# Patient Record
Sex: Female | Born: 1985 | Race: White | Hispanic: No | Marital: Married | Smoking: Never smoker
Health system: Southern US, Community
[De-identification: ages and names within clinical notes are randomized; demographics above are authoritative.]

---

## 2016-03-31 ENCOUNTER — Encounter: Payer: Self-pay | Admitting: Obstetrics and Gynecology

## 2016-03-31 ENCOUNTER — Ambulatory Visit (INDEPENDENT_AMBULATORY_CARE_PROVIDER_SITE_OTHER): Payer: BC Managed Care – PPO | Admitting: Obstetrics and Gynecology

## 2016-03-31 VITALS — BP 133/88 | HR 89 | Wt 138.0 lb

## 2016-03-31 DIAGNOSIS — Z1151 Encounter for screening for human papillomavirus (HPV): Secondary | ICD-10-CM | POA: Diagnosis not present

## 2016-03-31 DIAGNOSIS — Z0000 Encounter for general adult medical examination without abnormal findings: Secondary | ICD-10-CM | POA: Diagnosis not present

## 2016-03-31 DIAGNOSIS — Z01419 Encounter for gynecological examination (general) (routine) without abnormal findings: Secondary | ICD-10-CM | POA: Diagnosis not present

## 2016-03-31 MED ORDER — MEDROXYPROGESTERONE ACETATE 150 MG/ML IM SUSP: 150.0000 mg | INTRAMUSCULAR | 5 refills | Status: DC

## 2016-03-31 NOTE — Progress Notes (Signed)
Subjective:     Bailey Shaffer is a 31 y.o. female G0 P0 with depo-provera induced amenorrhea who is here for a comprehensive physical exam. The patient reports no problems. She relocated from [place] and is here to establish care. She had no complaints. She has been on depo-provera for several years and loves it. She denies pelvic pain or abnormal discharge. She is sexually active with the same partner for the past 7 years and desires to be restarted on her depo-provera. She is a Software engineer at [employer].   No past medical history on file. No past surgical history on file. No family history on file.  Social History   Social History  . Marital status: Married    Spouse name: N/A  . Number of children: N/A  . Years of education: N/A   Occupational History  . Not on file.   Social History Main Topics  . Smoking status: Never Smoker  . Smokeless tobacco: Never Used  . Alcohol use Yes  . Drug use: No  . Sexual activity: Yes    Partners: Male    Birth control/ protection: Injection   Other Topics Concern  . Not on file   Social History Narrative  . No narrative on file   Health Maintenance  Topic Date Due  . HIV Screening  09/09/1999  . TETANUS/TDAP  09/09/2003  . PAP SMEAR  09/08/2005  . INFLUENZA VACCINE  09/02/2015       Review of Systems Pertinent items are noted in HPI.   Objective:  Blood pressure 133/88, pulse 89, weight 138 lb (62.6 kg).     GENERAL: Well-developed, well-nourished female in no acute distress.  HEENT: Normocephalic, atraumatic. Sclerae anicteric.  NECK: Supple. Normal thyroid.  LUNGS: Clear to auscultation bilaterally.  HEART: Regular rate and rhythm. BREASTS: Symmetric in size. No palpable masses or lymphadenopathy, skin changes, or nipple drainage. ABDOMEN: Soft, nontender, nondistended.  PELVIC: Normal external female genitalia. Vagina is pink and rugated.  Normal discharge. Normal appearing cervix. Uterus is normal in size. No adnexal mass  or tenderness. EXTREMITIES: No cyanosis, clubbing, or edema, 2+ distal pulses.    Assessment:    Healthy female exam.      Plan:    pap smear collected Patient will be notified of abnormal results Rx depo-provera provided. Patient has been abstinent for 3 weeks RTC for depo-provera or prn See After Visit Summary for Counseling Recommendations

## 2016-04-02 ENCOUNTER — Ambulatory Visit (INDEPENDENT_AMBULATORY_CARE_PROVIDER_SITE_OTHER): Payer: BC Managed Care – PPO | Admitting: *Deleted

## 2016-04-02 DIAGNOSIS — Z30013 Encounter for initial prescription of injectable contraceptive: Secondary | ICD-10-CM

## 2016-04-02 DIAGNOSIS — Z3202 Encounter for pregnancy test, result negative: Secondary | ICD-10-CM | POA: Diagnosis not present

## 2016-04-02 LAB — POCT URINE PREGNANCY: Preg Test, Ur: NEGATIVE

## 2016-04-02 MED ORDER — MEDROXYPROGESTERONE ACETATE 150 MG/ML IM SUSP
150.0000 mg | Freq: Once | INTRAMUSCULAR | Status: AC
  Administered 2016-04-02: 150 mg via INTRAMUSCULAR

## 2016-04-05 LAB — CYTOLOGY - PAP
Diagnosis: NEGATIVE
HPV (WINDOPATH): NOT DETECTED

## 2016-06-24 ENCOUNTER — Ambulatory Visit (INDEPENDENT_AMBULATORY_CARE_PROVIDER_SITE_OTHER): Payer: BC Managed Care – PPO

## 2016-06-24 VITALS — Ht 63.0 in

## 2016-06-24 DIAGNOSIS — Z3042 Encounter for surveillance of injectable contraceptive: Secondary | ICD-10-CM

## 2016-06-24 MED ORDER — MEDROXYPROGESTERONE ACETATE 150 MG/ML IM SUSP
150.0000 mg | Freq: Once | INTRAMUSCULAR | Status: AC
  Administered 2016-06-24: 150 mg via INTRAMUSCULAR

## 2016-06-24 NOTE — Progress Notes (Addendum)
Depo given L upper outer quad w/o difficulty. Next depo due 8/15

## 2016-07-30 ENCOUNTER — Encounter (HOSPITAL_COMMUNITY): Payer: Self-pay | Admitting: *Deleted

## 2016-07-30 ENCOUNTER — Emergency Department (HOSPITAL_COMMUNITY): Payer: BC Managed Care – PPO

## 2016-07-30 ENCOUNTER — Observation Stay (HOSPITAL_COMMUNITY)
Admission: EM | Admit: 2016-07-30 | Discharge: 2016-07-31 | Disposition: A | Discharge status: Home / Self Care | Payer: BC Managed Care – PPO | Attending: General Surgery | Admitting: General Surgery

## 2016-07-30 DIAGNOSIS — R1011 Right upper quadrant pain: Secondary | ICD-10-CM

## 2016-07-30 DIAGNOSIS — Z881 Allergy status to other antibiotic agents status: Secondary | ICD-10-CM | POA: Insufficient documentation

## 2016-07-30 DIAGNOSIS — K8010 Calculus of gallbladder with chronic cholecystitis without obstruction: Secondary | ICD-10-CM | POA: Diagnosis not present

## 2016-07-30 DIAGNOSIS — K8020 Calculus of gallbladder without cholecystitis without obstruction: Secondary | ICD-10-CM | POA: Diagnosis present

## 2016-07-30 LAB — COMPREHENSIVE METABOLIC PANEL
ALK PHOS: 40 U/L (ref 38–126)
ALT: 18 U/L (ref 14–54)
AST: 25 U/L (ref 15–41)
Albumin: 4.3 g/dL (ref 3.5–5.0)
Anion gap: 10 (ref 5–15)
BUN: 7 mg/dL (ref 6–20)
CALCIUM: 9.1 mg/dL (ref 8.9–10.3)
CHLORIDE: 104 mmol/L (ref 101–111)
CO2: 22 mmol/L (ref 22–32)
CREATININE: 0.92 mg/dL (ref 0.44–1.00)
GFR calc Af Amer: >60 mL/min (ref >60)
Glucose, Bld: 147 mg/dL — ABNORMAL HIGH (ref 65–99)
Potassium: 3.8 mmol/L (ref 3.5–5.1)
Sodium: 136 mmol/L (ref 135–145)
Total Bilirubin: 0.6 mg/dL (ref 0.3–1.2)
Total Protein: 6.7 g/dL (ref 6.5–8.1)

## 2016-07-30 LAB — URINALYSIS, ROUTINE W REFLEX MICROSCOPIC
Bilirubin Urine: NEGATIVE
GLUCOSE, UA: 50 mg/dL — AB
HGB URINE DIPSTICK: NEGATIVE
KETONES UR: 80 mg/dL — AB
Leukocytes, UA: NEGATIVE
Nitrite: NEGATIVE
PROTEIN: NEGATIVE mg/dL
Specific Gravity, Urine: 1.044 — ABNORMAL HIGH (ref 1.005–1.030)
pH: 7 (ref 5.0–8.0)

## 2016-07-30 LAB — CBC
HCT: 41.9 % (ref 36.0–46.0)
HEMATOCRIT: 40.1 % (ref 36.0–46.0)
HEMOGLOBIN: 13.5 g/dL (ref 12.0–15.0)
Hemoglobin: 14.1 g/dL (ref 12.0–15.0)
MCH: 30.4 pg (ref 26.0–34.0)
MCH: 30.9 pg (ref 26.0–34.0)
MCHC: 33.7 g/dL (ref 30.0–36.0)
MCHC: 33.7 g/dL (ref 30.0–36.0)
MCV: 90.3 fL (ref 78.0–100.0)
MCV: 91.8 fL (ref 78.0–100.0)
PLATELETS: 282 10*3/uL (ref 150–400)
Platelets: 240 10*3/uL (ref 150–400)
RBC: 4.64 MIL/uL (ref 3.87–5.11)
RBC: 4.37 MIL/uL (ref 3.87–5.11)
RDW: 12 % (ref 11.5–15.5)
RDW: 12.2 % (ref 11.5–15.5)
WBC: 10.3 10*3/uL (ref 4.0–10.5)
WBC: 6.4 10*3/uL (ref 4.0–10.5)

## 2016-07-30 LAB — CREATININE, SERUM
CREATININE: 0.72 mg/dL (ref 0.44–1.00)
GFR calc non Af Amer: >60 mL/min (ref >60)

## 2016-07-30 LAB — I-STAT BETA HCG BLOOD, ED (MC, WL, AP ONLY): I-stat hCG, quantitative: <5 m[IU]/mL (ref <5)

## 2016-07-30 LAB — MRSA PCR SCREENING: MRSA by PCR: NEGATIVE

## 2016-07-30 LAB — LIPASE, BLOOD: LIPASE: 121 U/L — AB (ref 11–51)

## 2016-07-30 MED ORDER — SODIUM CHLORIDE 0.45 % IV SOLN
INTRAVENOUS | Status: DC
  Administered 2016-07-30 – 2016-07-31 (×2): via INTRAVENOUS
  Filled 2016-07-30 (×3): qty 1000

## 2016-07-30 MED ORDER — ONDANSETRON HCL 4 MG/2ML IJ SOLN
4.0000 mg | Freq: Four times a day (QID) | INTRAMUSCULAR | Status: DC | PRN
  Administered 2016-07-31: 4 mg via INTRAVENOUS

## 2016-07-30 MED ORDER — ONDANSETRON 4 MG PO TBDP: 4.0000 mg | ORAL_TABLET | Freq: Four times a day (QID) | ORAL | Status: DC | PRN

## 2016-07-30 MED ORDER — MORPHINE SULFATE (PF) 4 MG/ML IV SOLN
4.0000 mg | Freq: Once | INTRAVENOUS | Status: AC
  Administered 2016-07-30: 4 mg via INTRAVENOUS
  Filled 2016-07-30: qty 1

## 2016-07-30 MED ORDER — DEXTROSE 5 % IV SOLN
2.0000 g | Freq: Once | INTRAVENOUS | Status: AC
  Administered 2016-07-30: 2 g via INTRAVENOUS
  Filled 2016-07-30: qty 2

## 2016-07-30 MED ORDER — HYDRALAZINE HCL 20 MG/ML IJ SOLN: 10.0000 mg | INTRAMUSCULAR | Status: DC | PRN

## 2016-07-30 MED ORDER — KETOROLAC TROMETHAMINE 30 MG/ML IJ SOLN
30.0000 mg | Freq: Once | INTRAMUSCULAR | Status: AC
  Administered 2016-07-30: 30 mg via INTRAVENOUS
  Filled 2016-07-30: qty 1

## 2016-07-30 MED ORDER — ENOXAPARIN SODIUM 40 MG/0.4ML ~~LOC~~ SOLN: 40.0000 mg | SUBCUTANEOUS | Status: DC

## 2016-07-30 MED ORDER — ACETAMINOPHEN 325 MG PO TABS: 650.0000 mg | ORAL_TABLET | Freq: Four times a day (QID) | ORAL | Status: DC | PRN

## 2016-07-30 MED ORDER — SIMETHICONE 80 MG PO CHEW
40.0000 mg | CHEWABLE_TABLET | Freq: Four times a day (QID) | ORAL | Status: DC | PRN
  Administered 2016-07-31: 40 mg via ORAL
  Filled 2016-07-30: qty 1

## 2016-07-30 MED ORDER — DIPHENHYDRAMINE HCL 50 MG/ML IJ SOLN: 25.0000 mg | Freq: Four times a day (QID) | INTRAMUSCULAR | Status: DC | PRN

## 2016-07-30 MED ORDER — IOPAMIDOL (ISOVUE-300) INJECTION 61%
INTRAVENOUS | Status: AC
  Administered 2016-07-30: 100 mL
  Filled 2016-07-30: qty 100

## 2016-07-30 MED ORDER — MORPHINE SULFATE (PF) 4 MG/ML IV SOLN
1.0000 mg | INTRAVENOUS | Status: DC | PRN
  Administered 2016-07-30: 2 mg via INTRAVENOUS
  Administered 2016-07-30: 4 mg via INTRAVENOUS
  Administered 2016-07-31 (×2): 2 mg via INTRAVENOUS
  Filled 2016-07-30 (×4): qty 1

## 2016-07-30 MED ORDER — ONDANSETRON HCL 4 MG/2ML IJ SOLN
4.0000 mg | Freq: Once | INTRAMUSCULAR | Status: AC
  Administered 2016-07-30: 4 mg via INTRAVENOUS
  Filled 2016-07-30: qty 2

## 2016-07-30 MED ORDER — VORTIOXETINE HBR 5 MG PO TABS
5.0000 mg | ORAL_TABLET | Freq: Every day | ORAL | Status: DC
  Administered 2016-07-30 – 2016-07-31 (×2): 5 mg via ORAL
  Filled 2016-07-30 (×2): qty 1

## 2016-07-30 MED ORDER — DIPHENHYDRAMINE HCL 25 MG PO CAPS: 25.0000 mg | ORAL_CAPSULE | Freq: Four times a day (QID) | ORAL | Status: DC | PRN

## 2016-07-30 MED ORDER — SODIUM CHLORIDE 0.9 % IV BOLUS (SEPSIS)
1000.0000 mL | Freq: Once | INTRAVENOUS | Status: AC
Start: 2016-07-30 — End: 2016-07-30
  Administered 2016-07-30: 1000 mL via INTRAVENOUS

## 2016-07-30 MED ORDER — DEXTROSE 5 % IV SOLN
2.0000 g | INTRAVENOUS | Status: DC
  Filled 2016-07-30: qty 2

## 2016-07-30 NOTE — ED Notes (Signed)
Patient transported to Ultrasound 

## 2016-07-30 NOTE — ED Notes (Signed)
Patient transported to CT 

## 2016-07-30 NOTE — ED Notes (Signed)
Report given to floor RN; no questions.

## 2016-07-30 NOTE — H&P (Signed)
Bailey Shaffer is an 31 y.o. female.   Chief Complaint: abd pain HPI: 58 yof with ruq pain intermittently for several months presents with unrelenting abdominal pain in back and ruq.  She has nausea/vomiting and cannot tolerate orally.  She has no fever. Is having bowel movements. Interestingly in [place] she was told she had elevated lipase and gallstones during annual physical exam in recent past.  She has mildly elevated lipase and underwent an [place] that shows multiple gallstones in the neck, no sono murphys sign with 5.3 mm duct.  She also underwent a ct scan per the er that shows some periportal edema present.  Nothing is making pain better at home.  Was not going away.  Food has made worse in past.  History reviewed. No pertinent past medical history. anxiety  History reviewed. No pertinent surgical history. tonsillectomy  No family history on file. Social History:  reports that she has never smoked. She has never used smokeless tobacco. She reports that she drinks about 8.4 oz of alcohol per week . She reports that she does not use drugs.  Allergies:  Allergies  Allergen Reactions  . Erythromycin Rash    Pt states, "I am sensitive to most antibiotics."    meds reviewed  Results for orders placed or performed during the hospital encounter of 07/30/16 (from the past 48 hour(s))  Lipase, blood     Status: Abnormal   Collection Time: 07/30/16  8:31 AM  Result Value Ref Range   Lipase 121 (H) 11 - 51 U/L  Comprehensive metabolic panel     Status: Abnormal   Collection Time: 07/30/16  8:31 AM  Result Value Ref Range   Sodium 136 135 - 145 mmol/L   Potassium 3.8 3.5 - 5.1 mmol/L   Chloride 104 101 - 111 mmol/L   CO2 22 22 - 32 mmol/L   Glucose, Bld 147 (H) 65 - 99 mg/dL   BUN 7 6 - 20 mg/dL   Creatinine, Ser 0.92 0.44 - 1.00 mg/dL   Calcium 9.1 8.9 - 10.3 mg/dL   Total Protein 6.7 6.5 - 8.1 g/dL   Albumin 4.3 3.5 - 5.0 g/dL   AST 25 15 - 41 U/L   ALT 18 14 - 54 U/L   Alkaline  Phosphatase 40 38 - 126 U/L   Total Bilirubin 0.6 0.3 - 1.2 mg/dL   GFR calc non Af Amer >60 >60 mL/min   GFR calc Af Amer >60 >60 mL/min    Comment: (NOTE) The eGFR has been calculated using the CKD EPI equation. This calculation has not been validated in all clinical situations. eGFR's persistently <60 mL/min signify possible Chronic Kidney Disease.    Anion gap 10 5 - 15  CBC     Status: None   Collection Time: 07/30/16  8:31 AM  Result Value Ref Range   WBC 10.3 4.0 - 10.5 K/uL   RBC 4.64 3.87 - 5.11 MIL/uL   Hemoglobin 14.1 12.0 - 15.0 g/dL   HCT 41.9 36.0 - 46.0 %   MCV 90.3 78.0 - 100.0 fL   MCH 30.4 26.0 - 34.0 pg   MCHC 33.7 30.0 - 36.0 g/dL   RDW 12.0 11.5 - 15.5 %   Platelets 282 150 - 400 K/uL  I-Stat beta hCG blood, ED     Status: None   Collection Time: 07/30/16 11:31 AM  Result Value Ref Range   I-stat hCG, quantitative <5.0 <5 mIU/mL   Comment 3  Comment:   GEST. AGE      CONC.  (mIU/mL)   <=1 WEEK        5 - 50     2 WEEKS       50 - 500     3 WEEKS       100 - 10,000     4 WEEKS     1,000 - 30,000        FEMALE AND NON-PREGNANT FEMALE:     LESS THAN 5 mIU/mL    Ct Abdomen Pelvis W Contrast  Result Date: 07/30/2016 CLINICAL DATA:  Central abdominal pain since 5 a.m. this morning. Nausea and vomiting. EXAM: CT ABDOMEN AND PELVIS WITH CONTRAST TECHNIQUE: Multidetector CT imaging of the abdomen and pelvis was performed using the standard protocol following bolus administration of intravenous contrast. CONTRAST:  100 cc Isovue-300 COMPARISON:  Ultrasound exam from earlier today. FINDINGS: Lower chest:  Lung bases unremarkable. Hepatobiliary: No focal abnormality is seen within the liver parenchyma. There is evidence of periportal edema. Small amount of fluid is seen adjacent to the inferior vena cava near the hepatic vein confluence. Gallbladder is distended. Gallstones seen on ultrasound earlier today not well demonstrated by CT. Small volume  pericholecystic fluid is evident. No intrahepatic or extrahepatic biliary dilation. Pancreas: No focal mass lesion. No dilatation of the main duct. No intraparenchymal cyst. No peripancreatic edema. Spleen: No splenomegaly. No focal mass lesion. Adrenals/Urinary Tract: No adrenal nodule or mass. Kidneys are unremarkable. No evidence for hydroureter. The urinary bladder appears normal for the degree of distention. Stomach/Bowel: Stomach is nondistended. No gastric wall thickening. No evidence of outlet obstruction. Duodenum is normally positioned as is the ligament of Treitz. No small bowel wall thickening. No small bowel dilatation. The terminal ileum is normal. The appendix is normal. No gross colonic mass. No colonic wall thickening. No substantial diverticular change. Vascular/Lymphatic: No abdominal aortic aneurysm. No abdominal aortic atherosclerotic calcification. There is no gastrohepatic or hepatoduodenal ligament lymphadenopathy. No intraperitoneal or retroperitoneal lymphadenopathy. No pelvic sidewall lymphadenopathy. Reproductive: The uterus has normal CT imaging appearance. There is no adnexal mass. Other: No intraperitoneal free fluid. Musculoskeletal: Bone windows reveal no worrisome lytic or sclerotic osseous lesions. IMPRESSION: 1. Periportal edema with fluid between the liver and the IVC near the hepatic vein confluence and a tiny amount of fluid seen in the gallbladder fossa. Patient is known to have gallstones by ultrasound exam performed earlier today. Periportal edema is not classically associated with acute cholecystitis although edema secondary to gallbladder inflammation is a possibility. If clinical picture points towards acute cholecystitis, nuclear scintigraphy may prove helpful to assess for cystic duct obstruction. Acute hepatitis (including viral etiology) and cholangitis would also be considerations. Periportal edema from causes such as blunt force trauma, fluid resuscitation and  cardiac dysfunction unlikely given lack of such history. Electronically Signed   By: Misty Stanley M.D.   On: 07/30/2016 12:29   US Abdomen Limited Ruq  Result Date: 07/30/2016 CLINICAL DATA:  Right upper quadrant pain 6 hours with nausea vomiting EXAM: ULTRASOUND ABDOMEN LIMITED RIGHT UPPER QUADRANT COMPARISON:  None. FINDINGS: Gallbladder: Multiple gallstones measuring up to 21 mm in the neck of the gallbladder. Negative sonographic Murphy sign. Gallbladder wall not thickened. Common bile duct: Diameter: 5.3 mm Liver: No focal lesion identified. Within normal limits in parenchymal echogenicity. IMPRESSION: Cholelithiasis Electronically Signed   By: Franchot Gallo M.D.   On: 07/30/2016 10:32    Review of Systems  Constitutional: Negative for chills  and fever.  Cardiovascular: Negative for chest pain.  Gastrointestinal: Positive for abdominal pain, nausea and vomiting. Negative for constipation and diarrhea.  All other systems reviewed and are negative.   Blood pressure 106/64, pulse 90, temperature 97.6 F (36.4 C), temperature source Oral, height '5\' 4"'$  (1.626 m), weight 59 kg (130 lb), SpO2 100 %. Physical Exam  Vitals reviewed. Constitutional: She is oriented to person, place, and time. She appears well-developed and well-nourished.  HENT:  Head: Normocephalic and atraumatic.  Right Ear: External ear normal.  Left Ear: External ear normal.  Eyes: EOM are normal. No scleral icterus.  Neck: Neck supple.  Cardiovascular: Normal rate, regular rhythm and normal heart sounds.   Respiratory: Effort normal and breath sounds normal. She has no wheezes. She has no rales.  GI: Soft. Bowel sounds are normal. She exhibits no distension. There is tenderness in the right upper quadrant and epigastric area.  Lymphadenopathy:    She has no cervical adenopathy.  Neurological: She is alert and oriented to person, place, and time.  Skin: Skin is warm and dry. She is not diaphoretic.  Psychiatric: She  has a normal mood and affect. Her behavior is normal. Judgment and thought content normal.     Assessment/Plan Gallstones  May have had pancreatitis before not really consistent with that now given lipase mild elevation but ct does show some fluid.  She has symptomatic cholelithaisis and will plan on admission with iv abx given duration of pain. Will recheck labs in am and then if ok will proceed with lap chole tomorrow. I discussed the procedure in detail.  We discussed the risks and benefits of a laparoscopic cholecystectomy and possible cholangiogram including, but not limited to bleeding, infection, injury to surrounding structures such as the intestine or liver, bile leak, retained gallstones, need to convert to an open procedure, prolonged diarrhea, blood clots such as  DVT, common bile duct injury, anesthesia risks, and possible need for additional procedures.  The likelihood of improvement in symptoms and return to the patient's normal status is good. We discussed the typical post-operative recovery course.   Rolm Bookbinder, MD 07/30/2016, 1:26 PM

## 2016-07-30 NOTE — ED Notes (Signed)
Pt back from Ultrasound

## 2016-07-30 NOTE — ED Notes (Signed)
Pt asked to give a urine sample pt stated "i cant do anything".

## 2016-07-30 NOTE — ED Notes (Signed)
Surgery at bedside.

## 2016-07-30 NOTE — ED Provider Notes (Signed)
Emergency Department Provider Note   I have reviewed the triage vital signs and the nursing notes.   HISTORY  Chief Complaint Abdominal Pain   HPI Bailey Shaffer is a 31 y.o. female with PMH of intermittent upper abdominal pain presents to the emergency department for evaluation of sudden onset, severe epigastric pain. She describes it as a sharp pain like stabbing. She was having some discomfort yesterday evening but the pain became very severe at 4 AM this morning. She's had similar pain in the past but none this severe. Quality in [place] the patient had CT scan and ultrasound of the gallbladder which were reportedly normal. She reports having an abdominal MRI because of elevated lipase and amylase. She states that her pancreas, gallbladder, and liver were reportedly normal. Shortly after arrival to the emergency department she had some associated vomiting that was nonbloody. No diarrhea. No vaginal bleeding or discharge. The pain radiates to the bilateral flanks. Denies any lower abdominal pain. No modifying factors.   History reviewed. No pertinent past medical history.  Patient Active Problem List   Diagnosis Date Noted  . Symptomatic cholelithiasis 07/30/2016    History reviewed. No pertinent surgical history.  Current Outpatient Rx  . Order #: [id number] Class: Historical Med  . Order #: [id number] Class: Print  . Order #: [id number] Class: Historical Med    Allergies Erythromycin  No family history on file.  Social History Social History  Substance Use Topics  . Smoking status: Never Smoker  . Smokeless tobacco: Never Used  . Alcohol use 8.4 oz/week    14 Glasses of wine per week    Review of Systems  Constitutional: No fever/chills Eyes: No visual changes. ENT: No sore throat. Cardiovascular: Denies chest pain. Respiratory: Denies shortness of breath. Gastrointestinal: Positive epigastric abdominal pain. Positive nausea and vomiting.  No diarrhea.  No  constipation. Genitourinary: Negative for dysuria. Musculoskeletal: Negative for back pain. Skin: Negative for rash. Neurological: Negative for headaches, focal weakness or numbness.  10-point ROS otherwise negative.  ____________________________________________   PHYSICAL EXAM:  VITAL SIGNS: ED Triage Vitals  Enc Vitals Group     BP 07/30/16 0827 101/67     Pulse Rate 07/30/16 0827 (!) 58     Resp --      Temp 07/30/16 0827 97.6 F (36.4 C)     Temp Source 07/30/16 0827 Oral     SpO2 07/30/16 0827 100 %     Weight 07/30/16 0827 130 lb (59 kg)     Height 07/30/16 0827 5\' 4"  (1.626 m)     Pain Score 07/30/16 0833 9   Constitutional: Alert and oriented. Appears uncomfortable with frequent shifting in the bed.  Eyes: Conjunctivae are normal. Head: Atraumatic. Nose: No congestion/rhinnorhea. Mouth/Throat: Mucous membranes are moist.  Oropharynx non-erythematous. Neck: No stridor.   Cardiovascular: Bradycardia. Good peripheral circulation. Grossly normal heart sounds.   Respiratory: Normal respiratory effort.  No retractions. Lungs CTAB. Gastrointestinal: Soft with focal epigastric tenderness to the epigastric region, worse of the RUQ with positive Murphy's sign. No distention.  Musculoskeletal: No lower extremity tenderness nor edema. No gross deformities of extremities. Neurologic:  Normal speech and language. No gross focal neurologic deficits are appreciated.  Skin:  Skin is warm, dry and intact. No rash noted.  ____________________________________________   LABS (all labs ordered are listed, but only abnormal results are displayed)  Labs Reviewed  LIPASE, BLOOD - Abnormal; Notable for the following:       Result Value   Lipase 121 (*)  All other components within normal limits  COMPREHENSIVE METABOLIC PANEL - Abnormal; Notable for the following:    Glucose, Bld 147 (*)    All other components within normal limits  CBC  URINALYSIS, ROUTINE W REFLEX MICROSCOPIC    HIV ANTIBODY (ROUTINE TESTING)  CBC  CREATININE, SERUM  I-STAT BETA HCG BLOOD, ED ([facility], [facility], [facility] ONLY)   ____________________________________________  RADIOLOGY  Ct Abdomen Pelvis W Contrast  Result Date: 07/30/2016 CLINICAL DATA:  Central abdominal pain since 5 a.m. this morning. Nausea and vomiting. EXAM: CT ABDOMEN AND PELVIS WITH CONTRAST TECHNIQUE: Multidetector CT imaging of the abdomen and pelvis was performed using the standard protocol following bolus administration of intravenous contrast. CONTRAST:  100 cc Isovue-300 COMPARISON:  Ultrasound exam from earlier today. FINDINGS: Lower chest:  Lung bases unremarkable. Hepatobiliary: No focal abnormality is seen within the liver parenchyma. There is evidence of periportal edema. Small amount of fluid is seen adjacent to the inferior vena cava near the hepatic vein confluence. Gallbladder is distended. Gallstones seen on ultrasound earlier today not well demonstrated by CT. Small volume pericholecystic fluid is evident. No intrahepatic or extrahepatic biliary dilation. Pancreas: No focal mass lesion. No dilatation of the main duct. No intraparenchymal cyst. No peripancreatic edema. Spleen: No splenomegaly. No focal mass lesion. Adrenals/Urinary Tract: No adrenal nodule or mass. Kidneys are unremarkable. No evidence for hydroureter. The urinary bladder appears normal for the degree of distention. Stomach/Bowel: Stomach is nondistended. No gastric wall thickening. No evidence of outlet obstruction. Duodenum is normally positioned as is the ligament of Treitz. No small bowel wall thickening. No small bowel dilatation. The terminal ileum is normal. The appendix is normal. No gross colonic mass. No colonic wall thickening. No substantial diverticular change. Vascular/Lymphatic: No abdominal aortic aneurysm. No abdominal aortic atherosclerotic calcification. There is no gastrohepatic or hepatoduodenal ligament lymphadenopathy. No intraperitoneal or  retroperitoneal lymphadenopathy. No pelvic sidewall lymphadenopathy. Reproductive: The uterus has normal CT imaging appearance. There is no adnexal mass. Other: No intraperitoneal free fluid. Musculoskeletal: Bone windows reveal no worrisome lytic or sclerotic osseous lesions. IMPRESSION: 1. Periportal edema with fluid between the liver and the IVC near the hepatic vein confluence and a tiny amount of fluid seen in the gallbladder fossa. Patient is known to have gallstones by ultrasound exam performed earlier today. Periportal edema is not classically associated with acute cholecystitis although edema secondary to gallbladder inflammation is a possibility. If clinical picture points towards acute cholecystitis, nuclear scintigraphy may prove helpful to assess for cystic duct obstruction. Acute hepatitis (including viral etiology) and cholangitis would also be considerations. Periportal edema from causes such as blunt force trauma, fluid resuscitation and cardiac dysfunction unlikely given lack of such history. Electronically Signed   By: Kennith Center M.D.   On: 07/30/2016 12:29   US Abdomen Limited Ruq  Result Date: 07/30/2016 CLINICAL DATA:  Right upper quadrant pain 6 hours with nausea vomiting EXAM: ULTRASOUND ABDOMEN LIMITED RIGHT UPPER QUADRANT COMPARISON:  None. FINDINGS: Gallbladder: Multiple gallstones measuring up to 21 mm in the neck of the gallbladder. Negative sonographic Murphy sign. Gallbladder wall not thickened. Common bile duct: Diameter: 5.3 mm Liver: No focal lesion identified. Within normal limits in parenchymal echogenicity. IMPRESSION: Cholelithiasis Electronically Signed   By: Marlan Palau M.D.   On: 07/30/2016 10:32    ____________________________________________   PROCEDURES  Procedure(s) performed:   Procedures  None ____________________________________________   INITIAL IMPRESSION / ASSESSMENT AND PLAN / ED COURSE  Pertinent labs & imaging results that were  available during my care of the patient were reviewed by me and considered in my medical decision making (see chart for details).  Patient presents to the emergency department for evaluation of severe epigastric abdominal pain worse in the right upper quadrant. She apparently had a thorough workup in [place] including ultrasound, CT, MRI of the abdomen with no acute abnormality found. No history of upper endoscopy. The patient appears uncomfortable with some focal tenderness worse in the right upper quadrant. Plan for ultrasound of the right upper quadrant to evaluate the gallbladder. We'll treat with morphine and Zofran along with IV fluids in the interim. No lower abdominal symptoms to suggest pelvic etiology.   10:50 AM Patient looking more comfortable but still complaining of pain. Plan for CT for further investigation. Could be symptomatic cholelithiasis but will follow CT and reassess.   12:44 PM Patient looking more comfortable. Plan for Surgery consultation with small amount of fluid seen and clinical concern for symptomatic cholelithiasis.   Discussed patient's case with General Surgery. Patient and family (if present) updated with plan. Care transferred to Surgery service.  I reviewed all nursing notes, vitals, pertinent old records, EKGs, labs, imaging (as available).  ____________________________________________  FINAL CLINICAL IMPRESSION(S) / ED DIAGNOSES  Final diagnoses:  RUQ abdominal pain     MEDICATIONS GIVEN DURING THIS VISIT:  Medications  cefTRIAXone (ROCEPHIN) 2 g in dextrose 5 % 50 mL IVPB (not administered)  vortioxetine HBr (TRINTELLIX) TABS 5 mg (not administered)  enoxaparin (LOVENOX) injection 40 mg (not administered)  hydrALAZINE (APRESOLINE) injection 10 mg (not administered)  simethicone (MYLICON) chewable tablet 40 mg (not administered)  ondansetron (ZOFRAN-ODT) disintegrating tablet 4 mg (not administered)    Or  ondansetron (ZOFRAN) injection 4 mg  (not administered)  diphenhydrAMINE (BENADRYL) capsule 25 mg (not administered)    Or  diphenhydrAMINE (BENADRYL) injection 25 mg (not administered)  morphine 4 MG/ML injection 1-4 mg (not administered)  acetaminophen (TYLENOL) tablet 650 mg (not administered)  sodium chloride 0.45 % 1,000 mL infusion (not administered)  sodium chloride 0.9 % bolus 1,000 mL (0 mLs Intravenous Stopped 07/30/16 1123)  morphine 4 MG/ML injection 4 mg (4 mg Intravenous Given 07/30/16 1010)  ondansetron (ZOFRAN) injection 4 mg (4 mg Intravenous Given 07/30/16 1009)  sodium chloride 0.9 % bolus 1,000 mL (0 mLs Intravenous Stopped 07/30/16 1252)  ketorolac (TORADOL) 30 MG/ML injection 30 mg (30 mg Intravenous Given 07/30/16 1222)  iopamidol (ISOVUE-300) 61 % injection (100 mLs  Contrast Given 07/30/16 1202)     NEW OUTPATIENT MEDICATIONS STARTED DURING THIS VISIT:  None   Note:  This document was prepared using Dragon voice recognition software and may include unintentional dictation errors.  Alona Bene, MD Emergency Medicine    Edithe Dobbin, Arlyss Repress, MD 07/30/16 [id number]

## 2016-07-30 NOTE — ED Notes (Signed)
Consent signed at this time.

## 2016-07-30 NOTE — ED Triage Notes (Signed)
Pt reports hx of epigastric pain  & heartburn, pt reports worsening of pain in upper mid abd last night not relieved with medicine, pt denies n/v/d, A&O x4

## 2016-07-31 ENCOUNTER — Encounter (HOSPITAL_COMMUNITY)
Admission: EM | Disposition: A | Discharge status: Home / Self Care | Payer: Self-pay | Source: Home / Self Care | Attending: Emergency Medicine

## 2016-07-31 ENCOUNTER — Observation Stay (HOSPITAL_COMMUNITY): Payer: BC Managed Care – PPO | Admitting: Certified Registered Nurse Anesthetist

## 2016-07-31 ENCOUNTER — Encounter (HOSPITAL_COMMUNITY): Payer: Self-pay | Admitting: Certified Registered Nurse Anesthetist

## 2016-07-31 HISTORY — PX: CHOLECYSTECTOMY: SHX55

## 2016-07-31 LAB — CBC
HCT: 34.3 % — ABNORMAL LOW (ref 36.0–46.0)
Hemoglobin: 11.7 g/dL — ABNORMAL LOW (ref 12.0–15.0)
MCH: 31 pg (ref 26.0–34.0)
MCHC: 34.1 g/dL (ref 30.0–36.0)
MCV: 90.7 fL (ref 78.0–100.0)
PLATELETS: 198 10*3/uL (ref 150–400)
RBC: 3.78 MIL/uL — ABNORMAL LOW (ref 3.87–5.11)
RDW: 12.2 % (ref 11.5–15.5)
WBC: 7.8 10*3/uL (ref 4.0–10.5)

## 2016-07-31 LAB — COMPREHENSIVE METABOLIC PANEL
ALK PHOS: 34 U/L — AB (ref 38–126)
ALT: 33 U/L (ref 14–54)
AST: 31 U/L (ref 15–41)
Albumin: 3.1 g/dL — ABNORMAL LOW (ref 3.5–5.0)
Anion gap: 7 (ref 5–15)
CALCIUM: 8 mg/dL — AB (ref 8.9–10.3)
CHLORIDE: 108 mmol/L (ref 101–111)
CO2: 22 mmol/L (ref 22–32)
CREATININE: 0.74 mg/dL (ref 0.44–1.00)
GFR calc Af Amer: >60 mL/min (ref >60)
Glucose, Bld: 95 mg/dL (ref 65–99)
Potassium: 3.2 mmol/L — ABNORMAL LOW (ref 3.5–5.1)
SODIUM: 137 mmol/L (ref 135–145)
Total Bilirubin: 0.5 mg/dL (ref 0.3–1.2)
Total Protein: 5.1 g/dL — ABNORMAL LOW (ref 6.5–8.1)

## 2016-07-31 LAB — HIV ANTIBODY (ROUTINE TESTING W REFLEX): HIV SCREEN 4TH GENERATION: NONREACTIVE

## 2016-07-31 LAB — LIPASE, BLOOD: Lipase: 55 U/L — ABNORMAL HIGH (ref 11–51)

## 2016-07-31 SURGERY — LAPAROSCOPIC CHOLECYSTECTOMY WITH INTRAOPERATIVE CHOLANGIOGRAM
Anesthesia: General | Site: Abdomen

## 2016-07-31 MED ORDER — SUGAMMADEX SODIUM 200 MG/2ML IV SOLN
INTRAVENOUS | Status: AC
  Filled 2016-07-31: qty 2

## 2016-07-31 MED ORDER — KETOROLAC TROMETHAMINE 15 MG/ML IJ SOLN: 15.0000 mg | Freq: Four times a day (QID) | INTRAMUSCULAR | Status: DC | PRN

## 2016-07-31 MED ORDER — PROPOFOL 10 MG/ML IV BOLUS
INTRAVENOUS | Status: AC
Start: 2016-07-31 — End: ?
  Filled 2016-07-31: qty 20

## 2016-07-31 MED ORDER — CEFAZOLIN SODIUM 1 G IJ SOLR
INTRAMUSCULAR | Status: DC | PRN
  Administered 2016-07-31: 2 g via INTRAMUSCULAR

## 2016-07-31 MED ORDER — PROPOFOL 10 MG/ML IV BOLUS
INTRAVENOUS | Status: DC | PRN
  Administered 2016-07-31: 90 mg via INTRAVENOUS
  Administered 2016-07-31: 50 mg via INTRAVENOUS

## 2016-07-31 MED ORDER — LACTATED RINGERS IV SOLN
INTRAVENOUS | Status: DC
  Administered 2016-07-31 (×2): via INTRAVENOUS

## 2016-07-31 MED ORDER — SODIUM CHLORIDE 0.9 % IR SOLN
Status: DC | PRN
  Administered 2016-07-31: 1000 mL

## 2016-07-31 MED ORDER — FENTANYL CITRATE (PF) 100 MCG/2ML IJ SOLN
INTRAMUSCULAR | Status: DC | PRN
  Administered 2016-07-31: 50 ug via INTRAVENOUS
  Administered 2016-07-31 (×2): 100 ug via INTRAVENOUS

## 2016-07-31 MED ORDER — KETOROLAC TROMETHAMINE 30 MG/ML IJ SOLN
INTRAMUSCULAR | Status: AC
  Filled 2016-07-31: qty 1

## 2016-07-31 MED ORDER — MIDAZOLAM HCL 5 MG/5ML IJ SOLN
INTRAMUSCULAR | Status: DC | PRN
  Administered 2016-07-31: 2 mg via INTRAVENOUS

## 2016-07-31 MED ORDER — SUGAMMADEX SODIUM 200 MG/2ML IV SOLN
INTRAVENOUS | Status: DC | PRN
  Administered 2016-07-31: 200 mg via INTRAVENOUS

## 2016-07-31 MED ORDER — DEXAMETHASONE SODIUM PHOSPHATE 4 MG/ML IJ SOLN
INTRAMUSCULAR | Status: DC | PRN
  Administered 2016-07-31: 10 mg via INTRAVENOUS

## 2016-07-31 MED ORDER — FENTANYL CITRATE (PF) 250 MCG/5ML IJ SOLN
INTRAMUSCULAR | Status: AC
  Filled 2016-07-31: qty 5

## 2016-07-31 MED ORDER — BUPIVACAINE-EPINEPHRINE (PF) 0.25% -1:200000 IJ SOLN
INTRAMUSCULAR | Status: AC
  Filled 2016-07-31: qty 30

## 2016-07-31 MED ORDER — 0.9 % SODIUM CHLORIDE (POUR BTL) OPTIME
TOPICAL | Status: DC | PRN
  Administered 2016-07-31: 1000 mL

## 2016-07-31 MED ORDER — OXYCODONE HCL 5 MG/5ML PO SOLN
5.0000 mg | Freq: Once | ORAL | Status: DC | PRN
Start: 2016-07-31 — End: 2016-07-31

## 2016-07-31 MED ORDER — ROCURONIUM BROMIDE 100 MG/10ML IV SOLN
INTRAVENOUS | Status: DC | PRN
  Administered 2016-07-31: 50 mg via INTRAVENOUS
  Administered 2016-07-31: 10 mg via INTRAVENOUS

## 2016-07-31 MED ORDER — ONDANSETRON HCL 4 MG/2ML IJ SOLN
INTRAMUSCULAR | Status: AC
  Filled 2016-07-31: qty 2

## 2016-07-31 MED ORDER — OXYCODONE HCL 5 MG PO TABS
5.0000 mg | ORAL_TABLET | ORAL | Status: DC | PRN
  Administered 2016-07-31 (×2): 10 mg via ORAL
  Filled 2016-07-31 (×2): qty 2

## 2016-07-31 MED ORDER — MIDAZOLAM HCL 2 MG/2ML IJ SOLN
INTRAMUSCULAR | Status: AC
  Filled 2016-07-31: qty 2

## 2016-07-31 MED ORDER — OXYCODONE HCL 5 MG PO TABS: 5.0000 mg | ORAL_TABLET | Freq: Once | ORAL | 0 refills | Status: DC | PRN

## 2016-07-31 MED ORDER — OXYCODONE HCL 5 MG PO TABS: 5.0000 mg | ORAL_TABLET | Freq: Once | ORAL | Status: DC | PRN

## 2016-07-31 MED ORDER — BUPIVACAINE-EPINEPHRINE 0.25% -1:200000 IJ SOLN
INTRAMUSCULAR | Status: DC | PRN
  Administered 2016-07-31: 14 mL

## 2016-07-31 MED ORDER — DEXAMETHASONE SODIUM PHOSPHATE 10 MG/ML IJ SOLN
INTRAMUSCULAR | Status: AC
  Filled 2016-07-31: qty 1

## 2016-07-31 MED ORDER — LIDOCAINE 2% (20 MG/ML) 5 ML SYRINGE
INTRAMUSCULAR | Status: AC
  Filled 2016-07-31: qty 5

## 2016-07-31 MED ORDER — FENTANYL CITRATE (PF) 100 MCG/2ML IJ SOLN: 25.0000 ug | INTRAMUSCULAR | Status: DC | PRN

## 2016-07-31 MED ORDER — ROCURONIUM BROMIDE 10 MG/ML (PF) SYRINGE
PREFILLED_SYRINGE | INTRAVENOUS | Status: AC
  Filled 2016-07-31: qty 5

## 2016-07-31 MED ORDER — SODIUM CHLORIDE 0.9 % IV SOLN
INTRAVENOUS | Status: DC
  Administered 2016-07-31: 10:00:00 via INTRAVENOUS

## 2016-07-31 MED ORDER — KETOROLAC TROMETHAMINE 30 MG/ML IJ SOLN
INTRAMUSCULAR | Status: DC | PRN
  Administered 2016-07-31: 30 mg via INTRAVENOUS

## 2016-07-31 MED ORDER — IOPAMIDOL (ISOVUE-300) INJECTION 61%
INTRAVENOUS | Status: AC
  Filled 2016-07-31: qty 50

## 2016-07-31 SURGICAL SUPPLY — 38 items
APPLIER CLIP 5 13 M/L LIGAMAX5 (MISCELLANEOUS) ×2
BLADE CLIPPER SURG (BLADE) IMPLANT
CANISTER SUCT 3000ML PPV (MISCELLANEOUS) ×2 IMPLANT
CHLORAPREP W/TINT 26ML (MISCELLANEOUS) ×2 IMPLANT
CLIP APPLIE 5 13 M/L LIGAMAX5 (MISCELLANEOUS) ×1 IMPLANT
COVER MAYO STAND STRL (DRAPES) ×2 IMPLANT
COVER SURGICAL LIGHT HANDLE (MISCELLANEOUS) ×2 IMPLANT
DERMABOND ADVANCED (GAUZE/BANDAGES/DRESSINGS) ×1
DERMABOND ADVANCED .7 DNX12 (GAUZE/BANDAGES/DRESSINGS) ×1 IMPLANT
DEVICE TROCAR PUNCTURE CLOSURE (ENDOMECHANICALS) ×2 IMPLANT
DRAPE C-ARM 42X72 X-RAY (DRAPES) ×2 IMPLANT
ELECT REM PT RETURN 9FT ADLT (ELECTROSURGICAL) ×2
ELECTRODE REM PT RTRN 9FT ADLT (ELECTROSURGICAL) ×1 IMPLANT
GLOVE BIO SURGEON STRL SZ7 (GLOVE) ×2 IMPLANT
GLOVE BIOGEL PI IND STRL 7.5 (GLOVE) ×1 IMPLANT
GLOVE BIOGEL PI INDICATOR 7.5 (GLOVE) ×1
GOWN STRL REUS W/ TWL LRG LVL3 (GOWN DISPOSABLE) ×3 IMPLANT
GOWN STRL REUS W/TWL LRG LVL3 (GOWN DISPOSABLE) ×3
KIT BASIN OR (CUSTOM PROCEDURE TRAY) ×2 IMPLANT
KIT ROOM TURNOVER OR (KITS) ×2 IMPLANT
NS IRRIG 1000ML POUR BTL (IV SOLUTION) ×2 IMPLANT
PAD ARMBOARD 7.5X6 YLW CONV (MISCELLANEOUS) ×2 IMPLANT
POUCH RETRIEVAL ECOSAC 10 (ENDOMECHANICALS) ×1 IMPLANT
POUCH RETRIEVAL ECOSAC 10MM (ENDOMECHANICALS) ×1
SCISSORS LAP 5X35 DISP (ENDOMECHANICALS) ×2 IMPLANT
SET CHOLANGIOGRAPH 5 50 .035 (SET/KITS/TRAYS/PACK) ×2 IMPLANT
SET IRRIG TUBING LAPAROSCOPIC (IRRIGATION / IRRIGATOR) ×2 IMPLANT
SLEEVE ENDOPATH XCEL 5M (ENDOMECHANICALS) ×4 IMPLANT
SPECIMEN JAR SMALL (MISCELLANEOUS) ×2 IMPLANT
STRIP CLOSURE SKIN 1/2X4 (GAUZE/BANDAGES/DRESSINGS) ×2 IMPLANT
SUT MNCRL AB 4-0 PS2 18 (SUTURE) ×2 IMPLANT
SUT VICRYL 0 UR6 27IN ABS (SUTURE) ×2 IMPLANT
TOWEL OR 17X24 6PK STRL BLUE (TOWEL DISPOSABLE) ×2 IMPLANT
TOWEL OR 17X26 10 PK STRL BLUE (TOWEL DISPOSABLE) ×2 IMPLANT
TRAY LAPAROSCOPIC MC (CUSTOM PROCEDURE TRAY) ×2 IMPLANT
TROCAR XCEL BLUNT TIP 100MML (ENDOMECHANICALS) ×2 IMPLANT
TROCAR XCEL NON-BLD 5MMX100MML (ENDOMECHANICALS) ×2 IMPLANT
TUBING INSUFFLATION (TUBING) ×2 IMPLANT

## 2016-07-31 NOTE — Op Note (Signed)
Preoperative diagnosis: acute cholecystitis Postoperative diagnosis: saa Procedure: Laparoscopic cholecystectomy Surgeon: Dr. Harden MoMatt Zai Chmiel Anesthesia: Gen. Specimens: gb to pathology Estimated blood loss: 20 cc Complications: None Drains: none Sponge count was correct at completion Disposition to recovery stable  Indications: This is a [age] yof with cholecystitis.  I discussed proceeding to the or for lap chole.   Procedure: After informed consent was obtained the patient was taken to the operating room. She was given antibiotics. SCDs were in place. She was placed undergeneral anesthesia without complication. Herabdomen was prepped and draped in the standard sterile surgical fashion. A surgical timeout was then performed.  I then infiltrated Marcaine below the umbilicus. I made a vertical incision. I identified the fascia incised sharply. I entered into the peritoneum bluntly. There is no evidence of an entry injury. I then placed a 0 Vicryl pursestring suture. I then inserted a Hassan trocar and insufflated to 15 mmHg pressure. I then placed a 5 mm trocar in theepigastrium.Two5 mm trocars were placed in the right side of the abdomen. I grasped the gallbladder and retracted cephalad.The gallbladder was noted to have acute cholecystitis and was very inflamed. I aspirated the gallbladder to be able to grasp it.I was able to identify the critical view of safety. I identified the cystic artery. I divided this with two clips remaining in place. I proceeded to address the cystic duct. I placed three clips and divided this. The cystic duct was viable and the clips completely traversed the duct. Two clips were left in place. I then removed the gallbladder from the liver bedI placed it in a bag and removed from the umbilicus. I obtained hemostasis.I then [facility] the [facility] and tied the pursestring down. I did place two additional 2-0 Vicryl sutures at this area using the endoclose  device. I then removed the remaining trocars and these were closed with 4-0 Monocryl and glue. She tolerated this well be transferred recovery room.

## 2016-07-31 NOTE — Progress Notes (Signed)
Patient ID: Bailey PoeKasie Salas, female   DOB: 09/02/1985, 31 y.o.   MRN: [id number] Reviewed nccsr day of surgery ,postop pain rx given

## 2016-07-31 NOTE — Discharge Instructions (Signed)
[facility] LAPAROSCOPIC SURGERY: POST OP INSTRUCTIONS  Always review your discharge instruction sheet given to you by the facility where your surgery was performed. IF YOU HAVE DISABILITY OR FAMILY LEAVE FORMS, YOU MUST BRING THEM TO THE OFFICE FOR PROCESSING.   DO NOT GIVE THEM TO YOUR DOCTOR.  1. A prescription for pain medication may be given to you upon discharge.  Take your pain medication as prescribed, if needed.  If narcotic pain medicine is not needed, then you may take acetaminophen (Tylenol), naprosyn (Alleve), or ibuprofen (Advil) as needed. 2. Take your usually prescribed medications unless otherwise directed. 3. If you need a refill on your pain medication, please contact your pharmacy.  They will contact our office to request authorization. Prescriptions will not be filled after 5pm or on week-ends. 4. You should follow a light diet the first few days after arrival home, such as soup and crackers, etc.  Be sure to include lots of fluids daily. 5. Most patients will experience some swelling and bruising in the area of the incisions.  Ice packs will help.  Swelling and bruising can take several days to resolve.  6. It is common to experience some constipation if taking pain medication after surgery.  Increasing fluid intake and taking a stool softener (such as Colace) will usually help or prevent this problem from occurring.  A mild laxative (Milk of Magnesia or Miralax) should be taken according to package instructions if there are no bowel movements after 48 hours. 7. Unless discharge instructions indicate otherwise, you may remove your bandages 48 hours after surgery, and you may shower at that time.  You may have steri-strips (small skin tapes) in place directly over the incision.  These strips should be left on the skin for 7-10 days.  If your surgeon used skin glue on the incision, you may shower in 24 hours.  The glue will flake  off over the next 2-3 weeks.  Any sutures or staples will be removed at the office during your follow-up visit. 8. ACTIVITIES:  You may resume regular (light) daily activities beginning the next day--such as daily self-care, walking, climbing stairs--gradually increasing activities as tolerated.  You may have sexual intercourse when it is comfortable.  Refrain from any heavy lifting or straining until approved by your doctor. a. You may drive when you are no longer taking prescription pain medication, you can comfortably wear a seatbelt, and you can safely maneuver your car and apply brakes. b. RETURN TO WORK:  __________________________________________________________ 9. You should see your doctor in the office for a follow-up appointment approximately 2-3 weeks after your surgery.  Make sure that you call for this appointment within a day or two after you arrive home to insure a convenient appointment time. 10. OTHER INSTRUCTIONS: __________________________________________________________________________________________________________________________ __________________________________________________________________________________________________________________________ WHEN TO CALL YOUR DOCTOR: 1. Fever over 101.0 2. Inability to urinate 3. Continued bleeding from incision. 4. Increased pain, redness, or drainage from the incision. 5. Increasing abdominal pain  The clinic staff is available to answer your questions during regular business hours.  Please don't hesitate to call and ask to speak to one of the nurses for clinical concerns.  If you have a medical emergency, go to the nearest emergency room or call 911.  A surgeon from [facility] is always on call at the hospital. [address], [address], [place], [place]  [address] ? [address] [address], [place], [place]   [address] [phone] ? [phone] ? [phone]   [phone] Web site: [email or web address]  

## 2016-07-31 NOTE — Progress Notes (Signed)
Discharge instructions gone over with patient. Home medications gone over. Prescriptions given. Diet, activity, and reasons to call the doctor discussed.. Signs and symptoms of infection discussed. Follow up appointment to be made.patient verbalized understanding of instructions.

## 2016-07-31 NOTE — Anesthesia Postprocedure Evaluation (Signed)
Anesthesia Post Note  Patient: Bailey Shaffer  Procedure(s) Performed: Procedure(s) (LRB): LAPAROSCOPIC CHOLECYSTECTOMY WITH POSSIBLE INTRAOPERATIVE CHOLANGIOGRAM (N/A)     Patient location during evaluation: PACU Anesthesia Type: General Level of consciousness: awake and alert Pain management: pain level controlled Vital Signs Assessment: post-procedure vital signs reviewed and stable Respiratory status: spontaneous breathing, nonlabored ventilation, respiratory function stable and patient connected to nasal cannula oxygen Cardiovascular status: blood pressure returned to baseline and stable Postop Assessment: no signs of nausea or vomiting Anesthetic complications: no    Last Vitals:  Vitals:   07/31/16 0940 07/31/16 1327  BP: 107/68 111/72  Pulse: (!) 57 78  Resp:    Temp: 36.7 C 36.9 C    Last Pain:  Vitals:   07/31/16 1327  TempSrc: Oral  PainSc: 2                  Charlye Spare

## 2016-07-31 NOTE — Interval H&P Note (Signed)
History and Physical Interval Note:  07/31/2016 6:56 AM  Bailey Shaffer  has presented today for surgery, with the diagnosis of gallstones  The various methods of treatment have been discussed with the patient and family. After consideration of risks, benefits and other options for treatment, the patient has consented to  Procedure(s): LAPAROSCOPIC CHOLECYSTECTOMY WITH POSSIBLE INTRAOPERATIVE CHOLANGIOGRAM (N/A) as a surgical intervention .  The patient's history has been reviewed, patient examined, no change in status, stable for surgery.  I have reviewed the patient's chart and labs.  Questions were answered to the patient's satisfaction.     Arielis Leonhart

## 2016-07-31 NOTE — Anesthesia Preprocedure Evaluation (Signed)
Anesthesia Evaluation  Patient identified by MRN, date of birth, ID band Patient awake    Reviewed: Allergy & Precautions, NPO status , Patient's Chart, lab work & pertinent test results  History of Anesthesia Complications Negative for: history of anesthetic complications  Airway Mallampati: I  TM Distance: >3 FB Neck ROM: Full    Dental  (+) Teeth Intact   Pulmonary neg pulmonary ROS,    breath sounds clear to auscultation       Cardiovascular negative cardio ROS   Rhythm:Regular     Neuro/Psych negative neurological ROS  negative psych ROS   GI/Hepatic Neg liver ROS, gallstones   Endo/Other  negative endocrine ROS  Renal/GU negative Renal ROS     Musculoskeletal   Abdominal   Peds  Hematology negative hematology ROS (+)   Anesthesia Other Findings   Reproductive/Obstetrics                             Anesthesia Physical Anesthesia Plan  ASA: II  Anesthesia Plan: General   Post-op Pain Management:    Induction: Intravenous  PONV Risk Score and Plan: 3 and Ondansetron, Dexamethasone, Propofol and Midazolam  Airway Management Planned: Oral ETT  Additional Equipment: None  Intra-op Plan:   Post-operative Plan: Extubation in OR  Informed Consent: I have reviewed the patients History and Physical, chart, labs and discussed the procedure including the risks, benefits and alternatives for the proposed anesthesia with the patient or authorized representative who has indicated his/her understanding and acceptance.   Dental advisory given  Plan Discussed with: CRNA and Surgeon  Anesthesia Plan Comments:         Anesthesia Quick Evaluation

## 2016-07-31 NOTE — Discharge Summary (Signed)
Physician Discharge Summary  Patient ID: Bailey PoeKasie Hum MRN: [id number] DOB/AGE: 32/09/1985 32 y.o.  Admit date: 07/30/2016 Discharge date: 07/31/2016  Admission Diagnoses: Gallstones  Discharge Diagnoses:  Active Problems:   Symptomatic cholelithiasis   Discharged Condition: good  Hospital Course: [age] yof with history of gallstones presents with cholecystitis.  She was admitted and underwent laparoscopic cholecystectomy.  She did well and was discharged later that day.   Consults: none  Significant Diagnostic Studies: none  Treatment: lap chole   Disposition: 01-Home or Self Care   Allergies as of 07/31/2016      Reactions   Erythromycin Rash   Pt states, "I am sensitive to most antibiotics."      Medication List    TAKE these medications   loratadine 10 MG tablet Commonly known as:  CLARITIN Take 10 mg by mouth daily.   medroxyPROGESTERone 150 MG/ML injection Commonly known as:  DEPO-PROVERA Inject 1 mL (150 mg total) into the muscle every 3 (three) months.   oxyCODONE 5 MG immediate release tablet Commonly known as:  Oxy IR/ROXICODONE Take 1 tablet (5 mg total) by mouth once as needed (for pain score of 1-4).   TRINTELLIX 5 MG Tabs Generic drug:  vortioxetine HBr Take 5 mg by mouth daily.      Follow-up Information    [facility] Follow up in 3 week(s).   Specialty:  General Surgery Why:  call for appointment Contact information: [address] [place] [id number] [phone]          Signed: Emelia LoronWAKEFIELD,Abegail Kloeppel 07/31/2016, 5:01 PM

## 2016-07-31 NOTE — Transfer of Care (Signed)
Immediate Anesthesia Transfer of Care Note  Patient: Bailey Shaffer  Procedure(s) Performed: Procedure(s): LAPAROSCOPIC CHOLECYSTECTOMY WITH POSSIBLE INTRAOPERATIVE CHOLANGIOGRAM (N/A)  Patient Location: PACU  Anesthesia Type:General  Level of Consciousness: awake, alert  and oriented  Airway & Oxygen Therapy: Patient Spontanous Breathing and Patient connected to nasal cannula oxygen  Post-op Assessment: Report given to RN and Post -op Vital signs reviewed and stable  Post vital signs: Reviewed and stable  Last Vitals:  Vitals:   07/30/16 2125 07/31/16 0612  BP: 114/71 101/67  Pulse: 83 71  Resp: 18 18  Temp: 36.9 C 36.7 C    Last Pain:  Vitals:   07/31/16 0612  TempSrc: Oral  PainSc:       Patients Stated Pain Goal: 2 (07/31/16 0547)  Complications: No apparent anesthesia complications

## 2016-07-31 NOTE — Anesthesia Procedure Notes (Signed)
Procedure Name: Intubation Date/Time: 07/31/2016 7:44 AM Performed by: Reine JustFLOWERS, Mishka Stegemann T Pre-anesthesia Checklist: Patient identified, Emergency Drugs available, Suction available, Patient being monitored and Timeout performed Patient Re-evaluated:Patient Re-evaluated prior to inductionOxygen Delivery Method: Circle system utilized and Simple face mask Preoxygenation: Pre-oxygenation with 100% oxygen Intubation Type: IV induction Ventilation: Mask ventilation without difficulty Laryngoscope Size: Miller and 2 Grade View: Grade I Tube type: Oral Tube size: 7.5 mm Number of attempts: 1 Airway Equipment and Method: Patient positioned with wedge pillow and Stylet Placement Confirmation: ETT inserted through vocal cords under direct vision,  positive ETCO2 and breath sounds checked- equal and bilateral Secured at: 21 cm Tube secured with: Tape Dental Injury: Teeth and Oropharynx as per pre-operative assessment

## 2016-08-01 ENCOUNTER — Encounter (HOSPITAL_COMMUNITY): Payer: Self-pay | Admitting: General Surgery

## 2016-09-15 ENCOUNTER — Ambulatory Visit (INDEPENDENT_AMBULATORY_CARE_PROVIDER_SITE_OTHER): Payer: BC Managed Care – PPO

## 2016-09-15 VITALS — BP 123/82 | HR 98 | Wt 128.7 lb

## 2016-09-15 DIAGNOSIS — Z3042 Encounter for surveillance of injectable contraceptive: Secondary | ICD-10-CM | POA: Diagnosis not present

## 2016-09-15 MED ORDER — MEDROXYPROGESTERONE ACETATE 150 MG/ML IM SUSP
150.0000 mg | Freq: Once | INTRAMUSCULAR | Status: AC
  Administered 2016-09-15: 150 mg via INTRAMUSCULAR

## 2016-09-15 NOTE — Progress Notes (Signed)
Patient is here for DEPO. Given in LUOQ. Tolerated well. Next DEPO 10/31- 12/15/2016  Administrations This Visit    medroxyPROGESTERone (DEPO-PROVERA) injection 150 mg    Admin Date 09/15/2016 Action Given Dose 150 mg Route Intramuscular Administered By Maretta BeesMcGlashan, Suraiya Dickerson J, RMA

## 2016-11-12 ENCOUNTER — Ambulatory Visit: Payer: BC Managed Care – PPO

## 2016-12-13 ENCOUNTER — Ambulatory Visit (INDEPENDENT_AMBULATORY_CARE_PROVIDER_SITE_OTHER): Payer: BC Managed Care – PPO

## 2016-12-13 DIAGNOSIS — Z3042 Encounter for surveillance of injectable contraceptive: Secondary | ICD-10-CM | POA: Diagnosis not present

## 2016-12-13 MED ORDER — MEDROXYPROGESTERONE ACETATE 150 MG/ML IM SUSP
150.0000 mg | Freq: Once | INTRAMUSCULAR | Status: AC
  Administered 2016-12-13: 150 mg via INTRAMUSCULAR

## 2016-12-13 NOTE — Progress Notes (Signed)
Pt supplied Depo given L upper outer quad w/o difficulty. Pt is on time for inj. Next Depo due Jan 28-Feb 11 pt agrees

## 2016-12-13 NOTE — Progress Notes (Signed)
Agree with nursing staff's documentation of this patient's clinic encounter.  Tehran Rabenold, MD    

## 2017-03-08 ENCOUNTER — Ambulatory Visit (INDEPENDENT_AMBULATORY_CARE_PROVIDER_SITE_OTHER): Payer: BC Managed Care – PPO

## 2017-03-08 DIAGNOSIS — Z3042 Encounter for surveillance of injectable contraceptive: Secondary | ICD-10-CM | POA: Diagnosis not present

## 2017-03-08 MED ORDER — MEDROXYPROGESTERONE ACETATE 150 MG/ML IM SUSP
150.0000 mg | Freq: Once | INTRAMUSCULAR | Status: AC
  Administered 2017-03-08: 150 mg via INTRAMUSCULAR

## 2017-03-08 NOTE — Progress Notes (Signed)
Pt here for a depo shot. Inj given in right upper outer quad. Pt tolerated well. Next depo due 4/23-5/7.

## 2017-05-29 ENCOUNTER — Other Ambulatory Visit: Payer: Self-pay | Admitting: Obstetrics and Gynecology

## 2017-05-31 ENCOUNTER — Ambulatory Visit: Payer: BC Managed Care – PPO

## 2017-06-03 ENCOUNTER — Ambulatory Visit (INDEPENDENT_AMBULATORY_CARE_PROVIDER_SITE_OTHER): Payer: BC Managed Care – PPO

## 2017-06-03 DIAGNOSIS — Z3042 Encounter for surveillance of injectable contraceptive: Secondary | ICD-10-CM

## 2017-06-03 MED ORDER — MEDROXYPROGESTERONE ACETATE 150 MG/ML IM SUSP
150.0000 mg | Freq: Once | INTRAMUSCULAR | Status: AC
  Administered 2017-06-03: 150 mg via INTRAMUSCULAR

## 2017-06-03 NOTE — Progress Notes (Signed)
I have reviewed the chart and agree with nursing staff's documentation of this patient's encounter.  Roe Coombs, CNM 06/03/2017 11:21 AM

## 2017-06-03 NOTE — Progress Notes (Signed)
Pt here for depo shot. Pt is within her window. Inj given in LUOQ. Pt tolerated well. Next depo due 7/19-8/2.

## 2017-08-21 ENCOUNTER — Other Ambulatory Visit: Payer: Self-pay | Admitting: Obstetrics and Gynecology

## 2017-08-22 ENCOUNTER — Other Ambulatory Visit: Payer: Self-pay

## 2017-08-22 DIAGNOSIS — Z3042 Encounter for surveillance of injectable contraceptive: Secondary | ICD-10-CM

## 2017-08-22 MED ORDER — MEDROXYPROGESTERONE ACETATE 150 MG/ML IM SUSP: 150.0000 mg | INTRAMUSCULAR | 0 refills | Status: DC

## 2017-08-23 ENCOUNTER — Ambulatory Visit (INDEPENDENT_AMBULATORY_CARE_PROVIDER_SITE_OTHER): Payer: BC Managed Care – PPO

## 2017-08-23 DIAGNOSIS — Z3042 Encounter for surveillance of injectable contraceptive: Secondary | ICD-10-CM | POA: Diagnosis not present

## 2017-08-23 MED ORDER — MEDROXYPROGESTERONE ACETATE 150 MG/ML IM SUSP
150.0000 mg | Freq: Once | INTRAMUSCULAR | Status: AC
  Administered 2017-08-23: 150 mg via INTRAMUSCULAR

## 2017-08-23 NOTE — Progress Notes (Addendum)
Nurse visit for pt supplied Depo given RUOQ w/o difficulty. Pt is on time for inj. Next Depo due 10/8-10/22. Last AEX 03/31/16. Pt will schedule annual at checkout.   Attestation of Attending Supervision of CMA/RN: Evaluation and management procedures were performed by the nurse under my supervision and collaboration.  I have reviewed the nursing note and chart, and I agree with the management and plan.  Carolyn L. Harraway-Smith, M.D., Evern CoreFACOG

## 2017-09-19 ENCOUNTER — Ambulatory Visit (INDEPENDENT_AMBULATORY_CARE_PROVIDER_SITE_OTHER): Payer: BC Managed Care – PPO | Admitting: Obstetrics and Gynecology

## 2017-09-19 ENCOUNTER — Encounter: Payer: Self-pay | Admitting: Obstetrics and Gynecology

## 2017-09-19 VITALS — BP 148/97 | HR 112 | Ht 64.0 in | Wt 122.0 lb

## 2017-09-19 DIAGNOSIS — Z1151 Encounter for screening for human papillomavirus (HPV): Secondary | ICD-10-CM | POA: Diagnosis not present

## 2017-09-19 DIAGNOSIS — Z01419 Encounter for gynecological examination (general) (routine) without abnormal findings: Secondary | ICD-10-CM

## 2017-09-19 DIAGNOSIS — Z124 Encounter for screening for malignant neoplasm of cervix: Secondary | ICD-10-CM

## 2017-09-19 NOTE — Progress Notes (Signed)
Subjective:     Bailey Shaffer is a 32 y.o. female G0 with BMI 20 and amenorrhea due to depo-provera who is here for a comprehensive physical exam. The patient reports no problems. Patient is sexually active with her partner of 9 years. She denies any pelvic pain or abnormal discharge  History reviewed. No pertinent past medical history. Past Surgical History:  Procedure Laterality Date  . CHOLECYSTECTOMY N/A 07/31/2016   Procedure: LAPAROSCOPIC CHOLECYSTECTOMY WITH POSSIBLE INTRAOPERATIVE CHOLANGIOGRAM;  Surgeon: Emelia LoronWakefield, Matthew, MD;  [facility];  Service: General;  Laterality: N/A;   History reviewed. No pertinent family history.   Social History   Socioeconomic History  . Marital status: Married    Spouse name: Not on file  . Number of children: Not on file  . Years of education: Not on file  . Highest education level: Not on file  Occupational History  . Not on file  Social Needs  . Financial resource strain: Not on file  . Food insecurity:    Worry: Not on file    Inability: Not on file  . Transportation needs:    Medical: Not on file    Non-medical: Not on file  Tobacco Use  . Smoking status: Never Smoker  . Smokeless tobacco: Never Used  Substance and Sexual Activity  . Alcohol use: Yes    Alcohol/week: 14.0 standard drinks    Types: 14 Glasses of wine per week  . Drug use: No  . Sexual activity: Yes    Partners: Male    Birth control/protection: Injection  Lifestyle  . Physical activity:    Days per week: Not on file    Minutes per session: Not on file  . Stress: Not on file  Relationships  . Social connections:    Talks on phone: Not on file    Gets together: Not on file    Attends religious service: Not on file    Active member of club or organization: Not on file    Attends meetings of clubs or organizations: Not on file    Relationship status: Not on file  . Intimate partner violence:    Fear of current or ex partner: Not on file   Emotionally abused: Not on file    Physically abused: Not on file    Forced sexual activity: Not on file  Other Topics Concern  . Not on file  Social History Narrative  . Not on file   Health Maintenance  Topic Date Due  . TETANUS/TDAP  09/09/2003  . INFLUENZA VACCINE  09/01/2017  . PAP SMEAR  04/01/2019  . HIV Screening  Completed       Review of Systems Pertinent items are noted in HPI.   Objective:  Blood pressure (!) 148/97, pulse (!) 112, height 5\' 4"  (1.626 m), weight 122 lb (55.3 kg).     GENERAL: Well-developed, well-nourished female in no acute distress.  HEENT: Normocephalic, atraumatic. Sclerae anicteric.  NECK: Supple. Normal thyroid.  LUNGS: Clear to auscultation bilaterally.  HEART: Regular rate and rhythm. BREASTS: Symmetric in size. No palpable masses or lymphadenopathy, skin changes, or nipple drainage. ABDOMEN: Soft, nontender, nondistended. No organomegaly. PELVIC: Normal external female genitalia. Vagina is pink and rugated.  Normal discharge. Normal appearing cervix. Uterus is normal in size. No adnexal mass or tenderness. EXTREMITIES: No cyanosis, clubbing, or edema, 2+ distal pulses.    Assessment:    Healthy female exam.      Plan:    pap smear collected Patient  will be contacted with abnormal results Patient not interested in STI screen Continue depo-provera for contraception See After Visit Summary for Counseling Recommendations

## 2017-09-21 LAB — CYTOLOGY - PAP
Diagnosis: NEGATIVE
HPV: NOT DETECTED

## 2017-11-09 ENCOUNTER — Ambulatory Visit (INDEPENDENT_AMBULATORY_CARE_PROVIDER_SITE_OTHER): Payer: BC Managed Care – PPO

## 2017-11-09 DIAGNOSIS — Z3042 Encounter for surveillance of injectable contraceptive: Secondary | ICD-10-CM

## 2017-11-09 MED ORDER — MEDROXYPROGESTERONE ACETATE 150 MG/ML IM SUSP: 150.0000 mg | INTRAMUSCULAR | 0 refills | Status: DC

## 2017-11-09 MED ORDER — MEDROXYPROGESTERONE ACETATE 150 MG/ML IM SUSP: 150.0000 mg | INTRAMUSCULAR | 3 refills | Status: DC

## 2017-11-09 MED ORDER — MEDROXYPROGESTERONE ACETATE 150 MG/ML IM SUSP
150.0000 mg | Freq: Once | INTRAMUSCULAR | Status: AC
  Administered 2017-11-09: 150 mg via INTRAMUSCULAR

## 2017-11-09 NOTE — Progress Notes (Signed)
Presents for DEPO, injection, given in RUOQ, tolerated well. Next DEPO 12/25-01/09/2018  Administrations This Visit    medroxyPROGESTERone (DEPO-PROVERA) injection 150 mg    Admin Date 11/09/2017 Action Given Dose 150 mg Route Intramuscular Administered By Maretta Bees, RMA

## 2018-01-30 ENCOUNTER — Ambulatory Visit (INDEPENDENT_AMBULATORY_CARE_PROVIDER_SITE_OTHER): Payer: BC Managed Care – PPO

## 2018-01-30 DIAGNOSIS — Z3042 Encounter for surveillance of injectable contraceptive: Secondary | ICD-10-CM

## 2018-01-30 MED ORDER — MEDROXYPROGESTERONE ACETATE 150 MG/ML IM SUSP: 150.0000 mg | INTRAMUSCULAR | 4 refills | Status: AC

## 2018-01-30 MED ORDER — MEDROXYPROGESTERONE ACETATE 150 MG/ML IM SUSP
150.0000 mg | INTRAMUSCULAR | Status: AC
  Administered 2018-01-30 – 2019-01-01 (×4): 150 mg via INTRAMUSCULAR

## 2018-01-30 NOTE — Progress Notes (Signed)
Depo injection LUOQ  Next Depo Due 04/18/18-05/02/18  Pt supplied  Pt had pap 09/19/17 Refill sent for Depo

## 2018-07-03 ENCOUNTER — Other Ambulatory Visit: Payer: Self-pay

## 2018-07-03 ENCOUNTER — Ambulatory Visit (INDEPENDENT_AMBULATORY_CARE_PROVIDER_SITE_OTHER): Payer: BC Managed Care – PPO

## 2018-07-03 VITALS — BP 134/85 | HR 72 | Ht 64.0 in | Wt 138.3 lb

## 2018-07-03 DIAGNOSIS — Z3202 Encounter for pregnancy test, result negative: Secondary | ICD-10-CM

## 2018-07-03 LAB — POCT URINE PREGNANCY: Preg Test, Ur: NEGATIVE

## 2018-07-03 MED ORDER — MEDROXYPROGESTERONE ACETATE 150 MG/ML IM SUSP: 150.0000 mg | INTRAMUSCULAR | 2 refills | Status: AC

## 2018-07-03 NOTE — Progress Notes (Signed)
UPT today is Negative.  Ms. Morman presents today for 1st UPT to restart DEPO. She has no unusual complaints. LMP: Patient is on DEPO    OBJECTIVE: Appears well, in no apparent distress.   Home UPT Result: NA In-Office UPT result:NEGATIVE  I have reviewed the patient's medical,  histories, and medications.   ASSESSMENT:NEGATIVE pregnancy test  PLAN Return in 2 weeks for 2nd. UPT/DEPO. Abstain from sex for the next 2 weeks. Rx sent to Pharmacy for patient to take to appt in 2 weeks.   Marland Kitchen

## 2018-07-17 ENCOUNTER — Other Ambulatory Visit: Payer: Self-pay

## 2018-07-17 ENCOUNTER — Other Ambulatory Visit (INDEPENDENT_AMBULATORY_CARE_PROVIDER_SITE_OTHER): Payer: BC Managed Care – PPO

## 2018-07-17 VITALS — BP 127/84 | HR 72 | Temp 98.6°F | Ht 64.0 in | Wt 136.6 lb

## 2018-07-17 DIAGNOSIS — Z3202 Encounter for pregnancy test, result negative: Secondary | ICD-10-CM

## 2018-07-17 DIAGNOSIS — Z3042 Encounter for surveillance of injectable contraceptive: Secondary | ICD-10-CM | POA: Diagnosis not present

## 2018-07-17 LAB — POCT URINE PREGNANCY: Preg Test, Ur: NEGATIVE

## 2018-07-17 NOTE — Progress Notes (Signed)
I have reviewed this chart and agree with the RN/CMA assessment and management.    K. Meryl Davis, M.D. [facility]ending [facility] (Faculty Practice)   

## 2018-07-17 NOTE — Progress Notes (Signed)
   Subjective:  Pt in for Depo Provera injection.    Objective: Need for contraception.   Assessment: Pregnancy test negative. Pt tolerated Depo injection. Depo given left upper outer quadrant.  Plan:   Next injection due 10/02/2018-10/16/2018.  Reminder card given. Derl Barrow, RN

## 2018-07-28 IMAGING — US US ABDOMEN LIMITED
1 series · 14 of 25 positions shown · non-contrast
Comparison: None.

CLINICAL DATA: Right upper quadrant pain 6 hours with nausea
vomiting

EXAM:
ULTRASOUND ABDOMEN LIMITED RIGHT UPPER QUADRANT

[Series 1: us abdomen limited · 0.23mm/px · 14 of 55 slices shown]
[im 1/55]
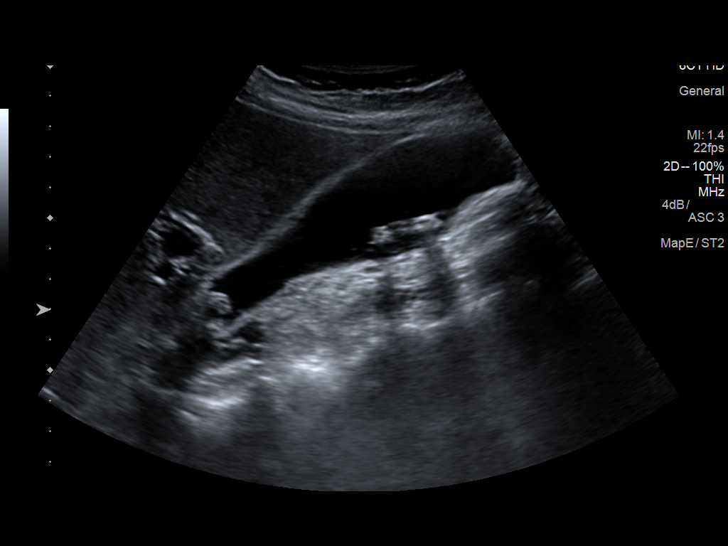
[im 5/55]
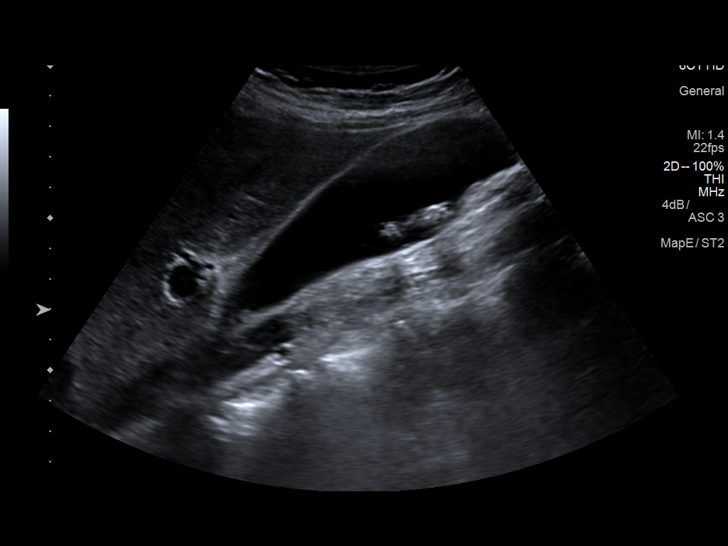
[im 10/55]
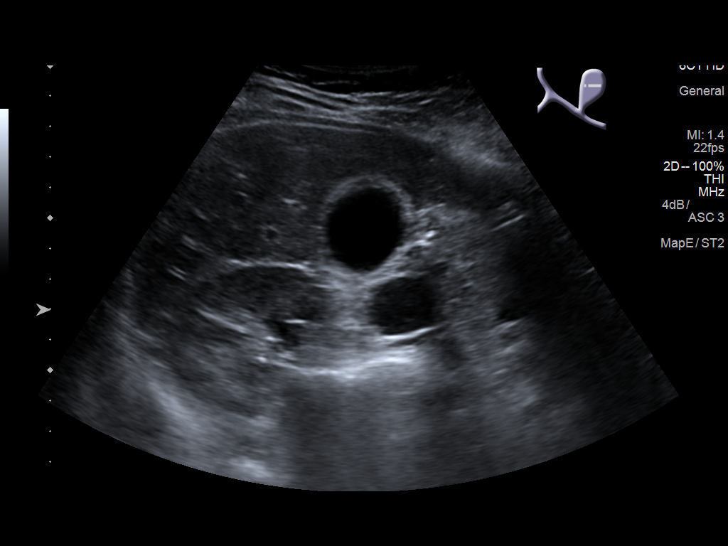
[im 14/55]
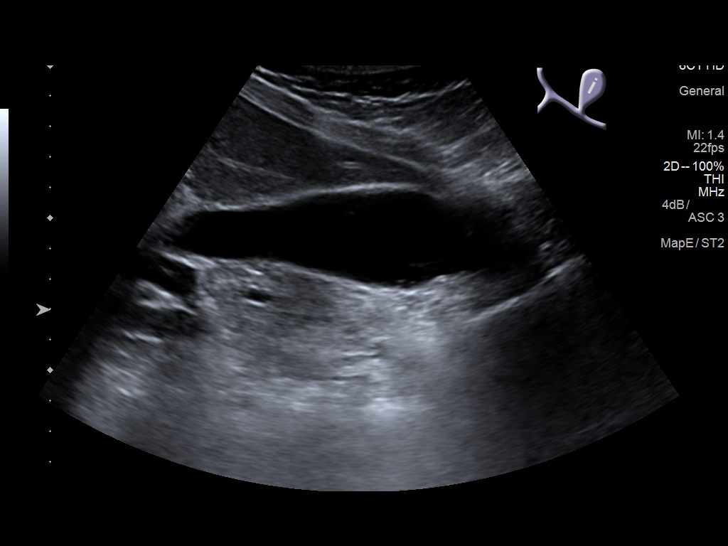
[im 19/55]
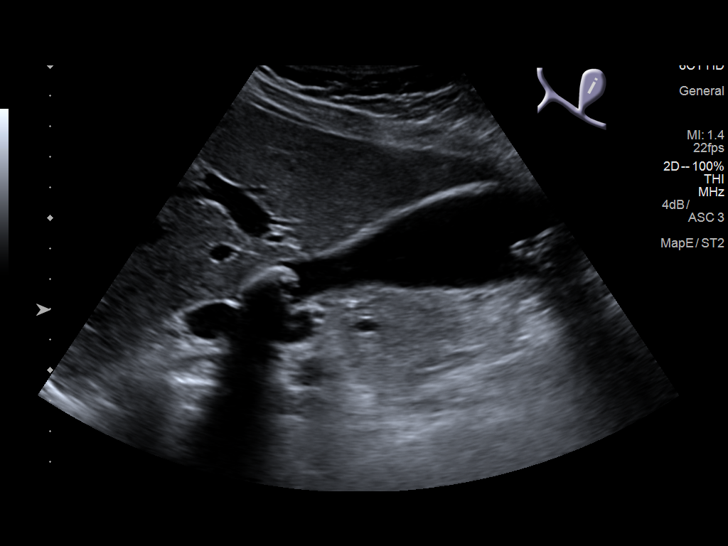
[im 21/55]
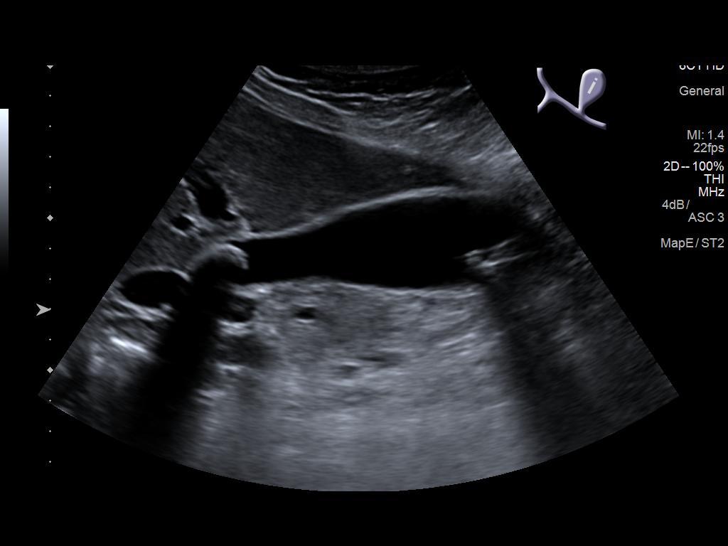
[im 25/55]
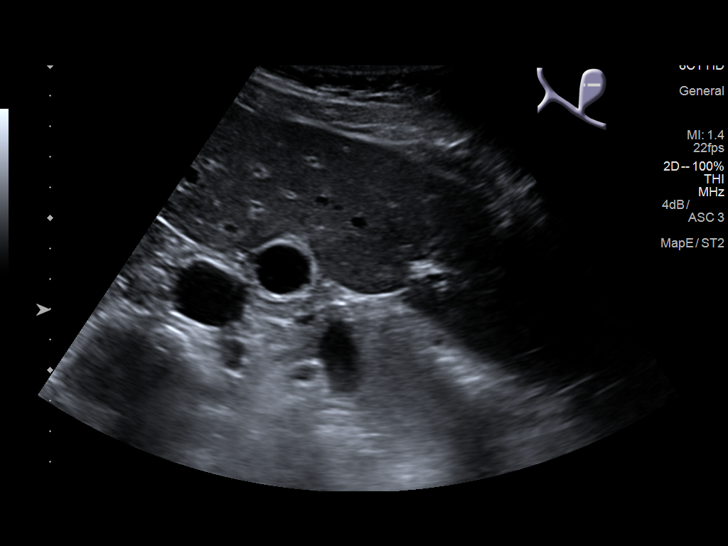
[im 30/55]
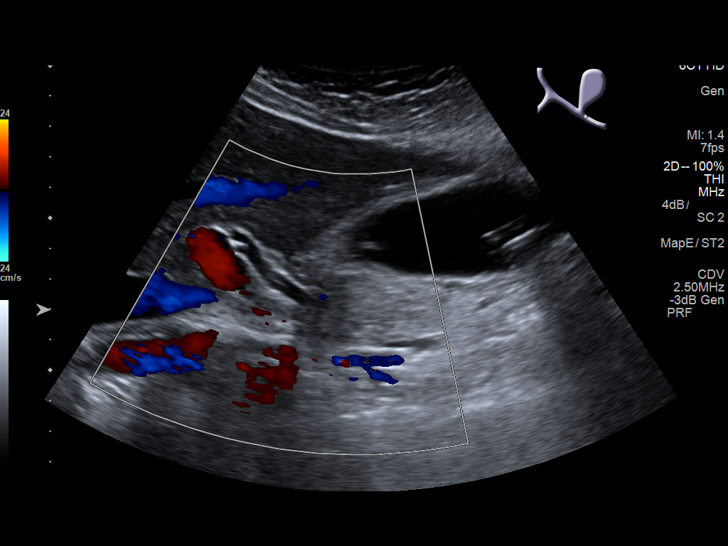
[im 34/55]
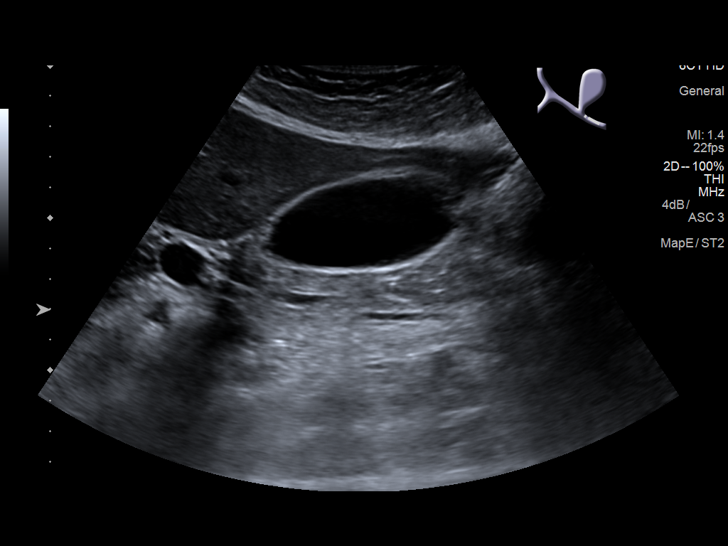
[im 37/55]
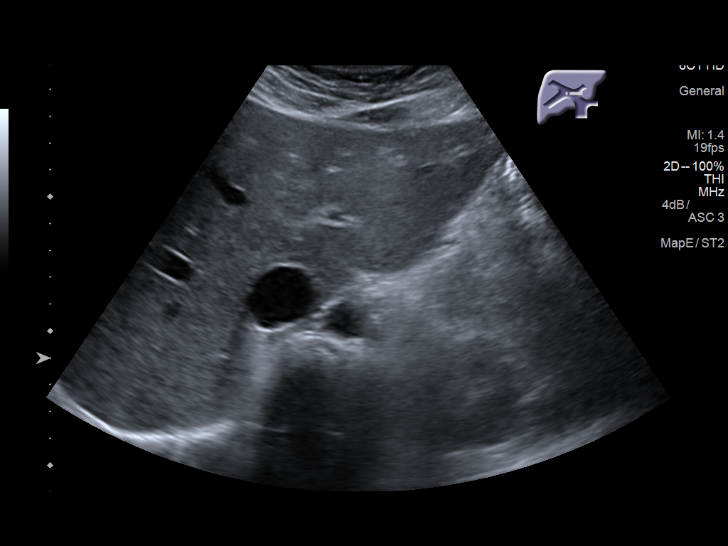
[im 41/55]
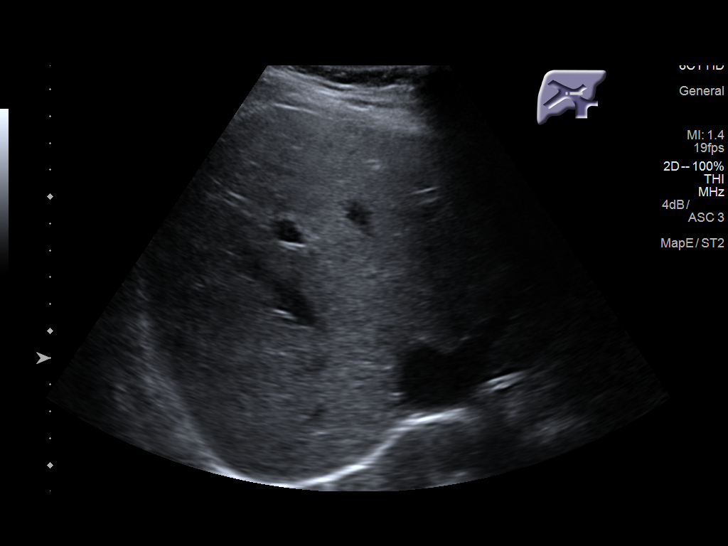
[im 46/55]
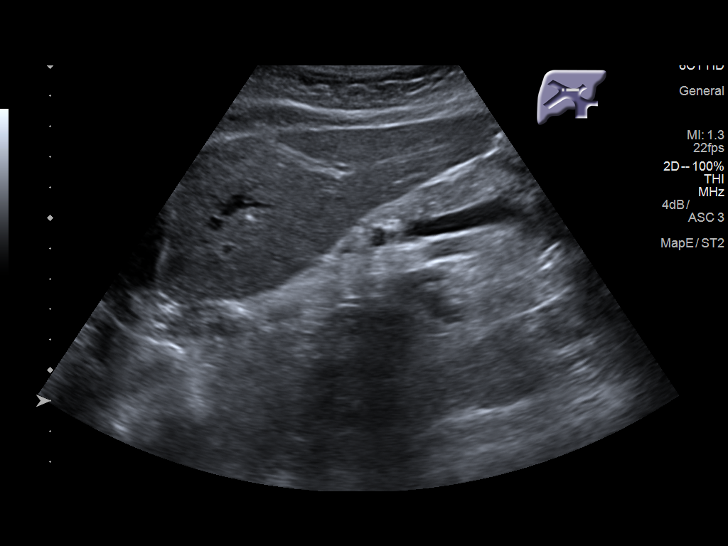
[im 50/55]
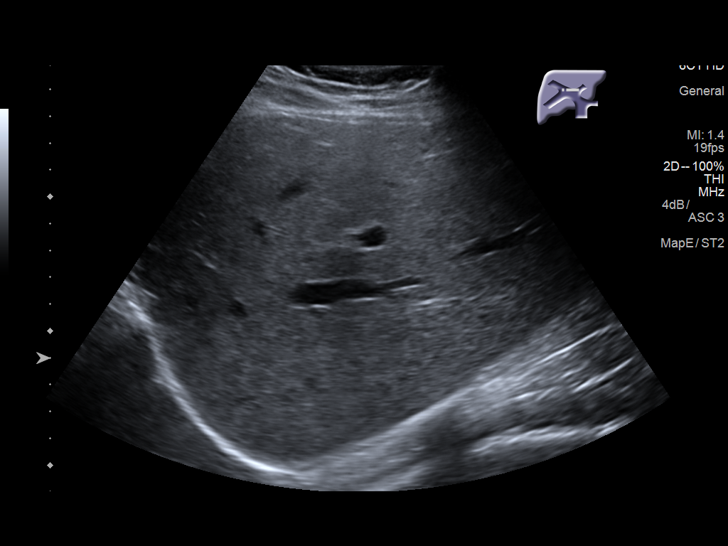
[im 55/55]
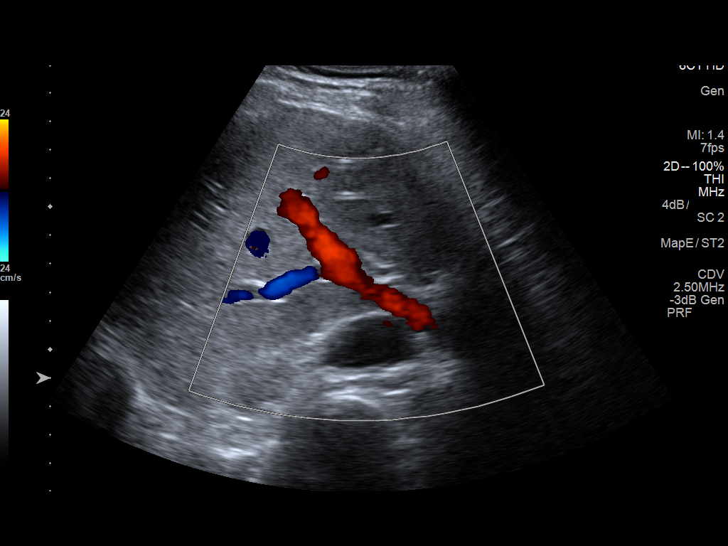

[14 of 25 positions shown; findings below may reference images not displayed]

FINDINGS: Gallbladder:

Multiple gallstones measuring up to 21 mm in the neck of the
gallbladder. Negative sonographic Murphy sign. Gallbladder wall not
thickened.

Common bile duct:

Diameter: 5.3 mm

Liver:

No focal lesion identified. Within normal limits in parenchymal
echogenicity.
IMPRESSION: Cholelithiasis

## 2018-10-09 ENCOUNTER — Ambulatory Visit: Payer: BC Managed Care – PPO

## 2018-10-10 IMAGING — CT CT ABD-PELV W/ CM
2 of 4 series · 15 of 46 positions shown, 17 images · IV contrast (APPLIED)
Comparison: Ultrasound exam from earlier today.

CLINICAL DATA: Central abdominal pain since 5 a.m. this morning.
Nausea and vomiting.

EXAM:
CT ABDOMEN AND PELVIS WITH CONTRAST
TECHNIQUE: Multidetector CT imaging of the abdomen and pelvis was performed
using the standard protocol following bolus administration of
intravenous contrast.
CONTRAST:  100 cc [id number]

[Series 3: abd/ pelvis 5.0 i30f 2 · axial · 0.71mm/px · z∈[+774,+1194]mm · 12 of 94 slices shown, 14 images]
[im 5/94  soft-tissue]
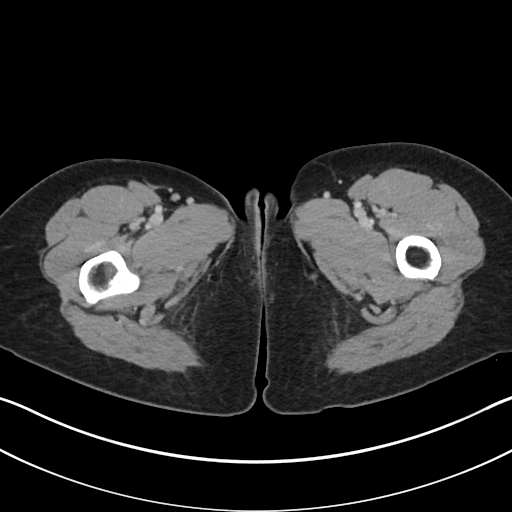
[im 5/94  bone]
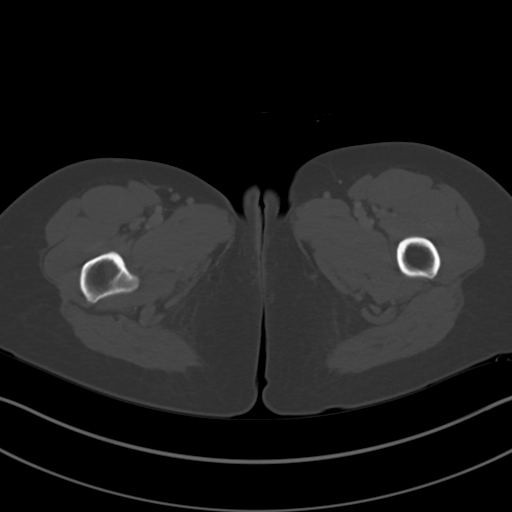
[im 13/94  soft-tissue]
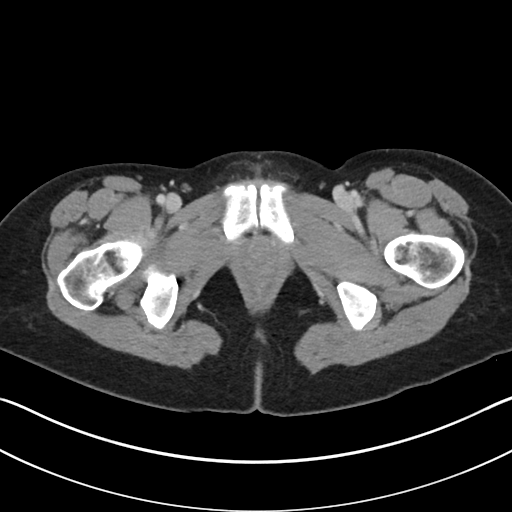
[im 21/94  soft-tissue]
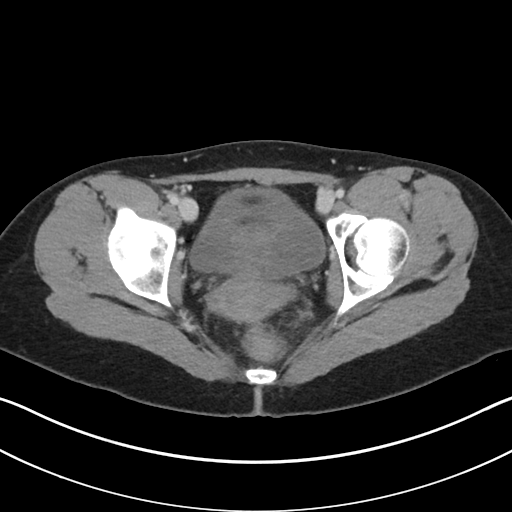
[im 29/94  soft-tissue]
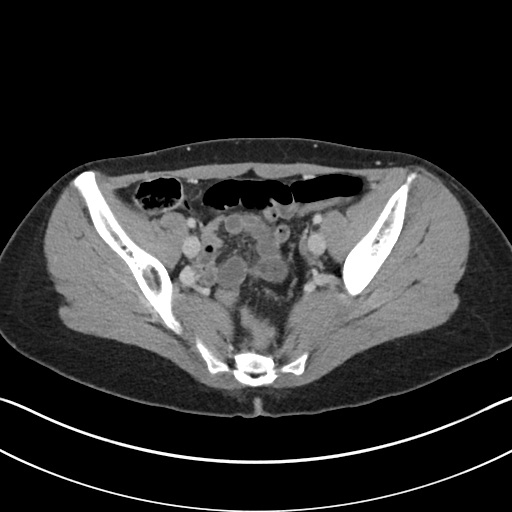
[im 37/94  soft-tissue]
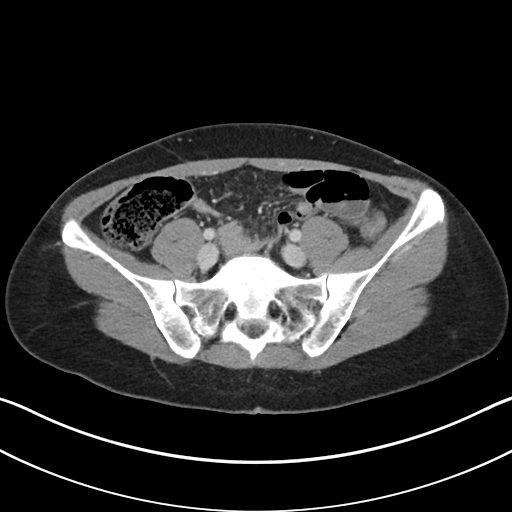
[im 45/94  soft-tissue]
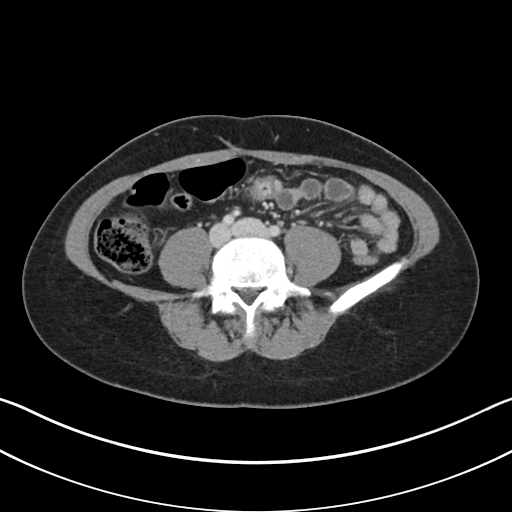
[im 49/94  soft-tissue]
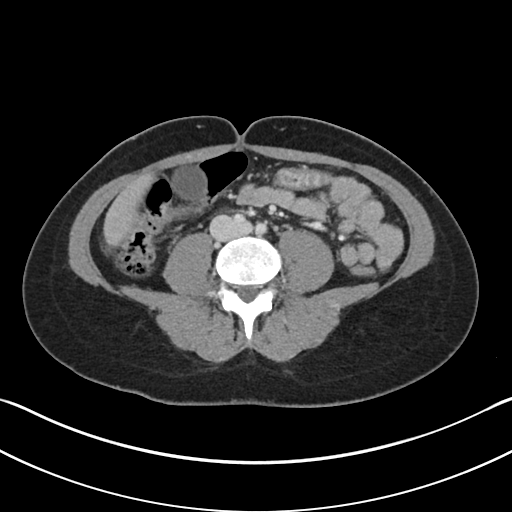
[im 57/94  soft-tissue]
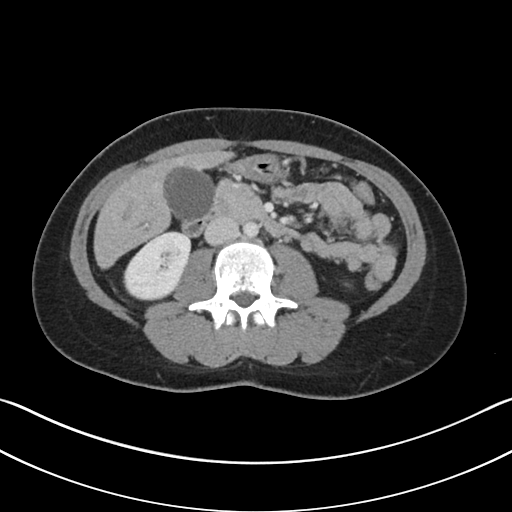
[im 65/94  soft-tissue]
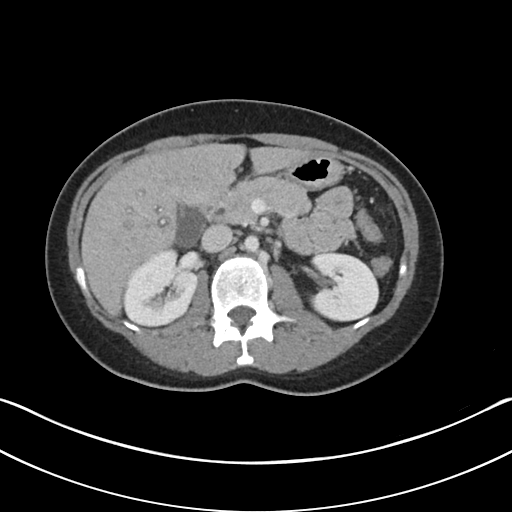
[im 65/94  bone]
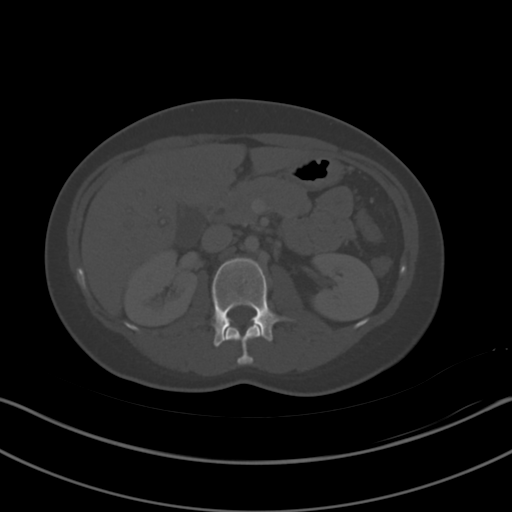
[im 73/94  soft-tissue]
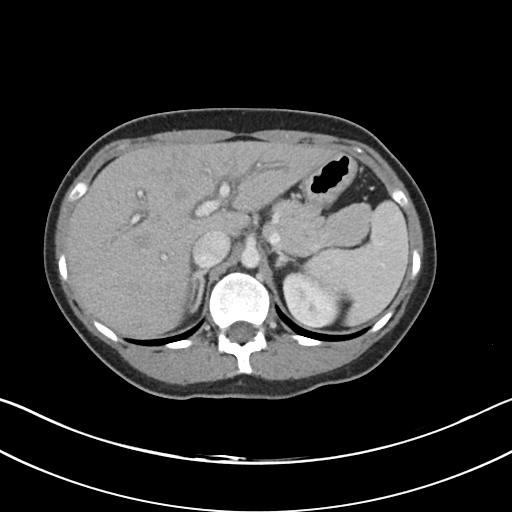
[im 81/94  soft-tissue]
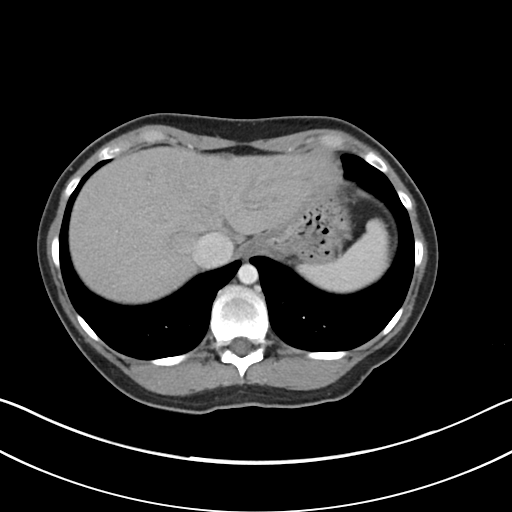
[im 89/94  soft-tissue]
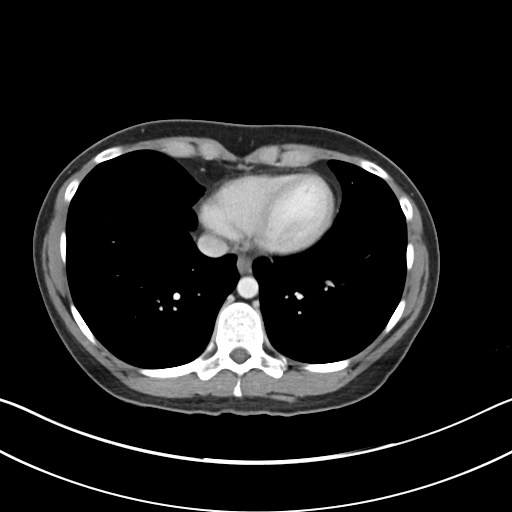

[Series 6: coronal soft tissue · coronal · 0.80mm/px · 3 of 92 slices shown]
[im 31/92  soft-tissue]
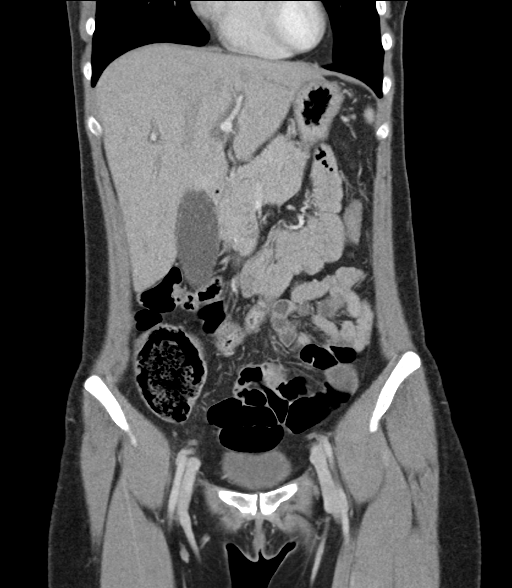
[im 41/92  soft-tissue]
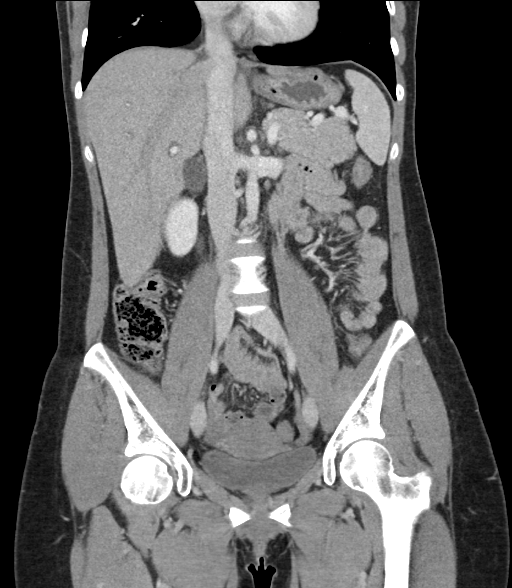
[im 51/92  soft-tissue]
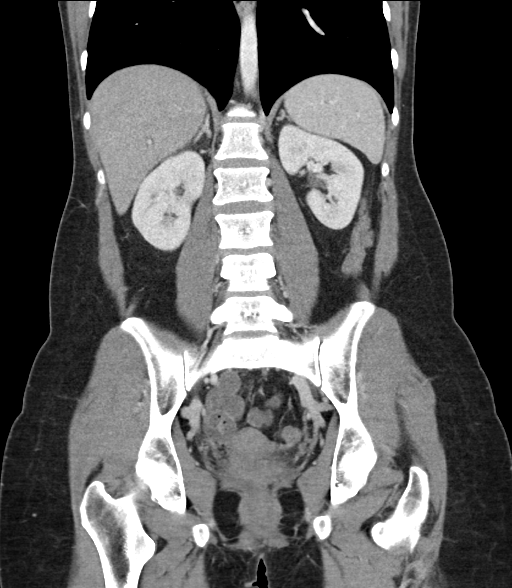

[15 of 46 positions shown; findings below may reference images not displayed]

FINDINGS: Lower chest:  Lung bases unremarkable.

Hepatobiliary: No focal abnormality is seen within the liver
parenchyma. There is evidence of periportal edema. Small amount of
fluid is seen adjacent to the inferior vena cava near the hepatic
vein confluence. Gallbladder is distended. Gallstones seen on
ultrasound earlier today not well demonstrated by CT. Small volume
pericholecystic fluid is evident. No intrahepatic or extrahepatic
biliary dilation.

Pancreas: No focal mass lesion. No dilatation of the main duct. No
intraparenchymal cyst. No peripancreatic edema.

Spleen: No splenomegaly. No focal mass lesion.

Adrenals/Urinary Tract: No adrenal nodule or mass. Kidneys are
unremarkable. No evidence for hydroureter. The urinary bladder
appears normal for the degree of distention.

Stomach/Bowel: Stomach is nondistended. No gastric wall thickening.
No evidence of outlet obstruction. Duodenum is normally positioned
as is the ligament of Treitz. No small bowel wall thickening. No
small bowel dilatation. The terminal ileum is normal. The appendix
is normal. No gross colonic mass. No colonic wall thickening. No
substantial diverticular change.

Vascular/Lymphatic: No abdominal aortic aneurysm. No abdominal
aortic atherosclerotic calcification. There is no gastrohepatic or
hepatoduodenal ligament lymphadenopathy. No intraperitoneal or
retroperitoneal lymphadenopathy. No pelvic sidewall lymphadenopathy.

Reproductive: The uterus has normal CT imaging appearance. There is
no adnexal mass.

Other: No intraperitoneal free fluid.

Musculoskeletal: Bone windows reveal no worrisome lytic or sclerotic
osseous lesions.
IMPRESSION: 1. Periportal edema with fluid between the liver and the IVC near
the hepatic vein confluence and a tiny amount of fluid seen in the
gallbladder fossa. Patient is known to have gallstones by ultrasound
exam performed earlier today. Periportal edema is not classically
associated with acute cholecystitis although edema secondary to
gallbladder inflammation is a possibility. If clinical picture
points towards acute cholecystitis, nuclear scintigraphy may prove
helpful to assess for cystic duct obstruction. Acute hepatitis
(including viral etiology) and cholangitis would also be
considerations. Periportal edema from causes such as blunt force
trauma, fluid resuscitation and cardiac dysfunction unlikely given
lack of such history.

## 2018-10-11 ENCOUNTER — Ambulatory Visit (INDEPENDENT_AMBULATORY_CARE_PROVIDER_SITE_OTHER): Payer: BC Managed Care – PPO | Admitting: *Deleted

## 2018-10-11 ENCOUNTER — Other Ambulatory Visit: Payer: Self-pay

## 2018-10-11 DIAGNOSIS — Z3042 Encounter for surveillance of injectable contraceptive: Secondary | ICD-10-CM

## 2018-10-11 NOTE — Progress Notes (Signed)
Pt is in office for depo injection.  Pt is on time for injection. Injection given, pt tolerated well.   Pt advised to RTO 11/25-12/9/20 for next depo. Pt made aware she may be able to schedule AEX at this time as well.   Pt has no other concerns today.    Administrations This Visit    medroxyPROGESTERone (DEPO-PROVERA) injection 150 mg    Admin Date 10/11/2018 Action Given Dose 150 mg Route Intramuscular Administered By Valene Bors, CMA

## 2018-10-12 NOTE — Progress Notes (Signed)
Patient seen and assessed by nursing staff during this encounter. I have reviewed the chart and agree with the documentation and plan.  Mora Bellman, MD 10/12/2018 9:04 AM

## 2018-11-07 ENCOUNTER — Other Ambulatory Visit: Payer: Self-pay

## 2018-11-07 DIAGNOSIS — Z20822 Contact with and (suspected) exposure to covid-19: Secondary | ICD-10-CM

## 2018-11-09 LAB — NOVEL CORONAVIRUS, NAA: SARS-CoV-2, NAA: NOT DETECTED

## 2019-01-01 ENCOUNTER — Other Ambulatory Visit: Payer: Self-pay

## 2019-01-01 ENCOUNTER — Ambulatory Visit (INDEPENDENT_AMBULATORY_CARE_PROVIDER_SITE_OTHER): Payer: BC Managed Care – PPO | Admitting: Obstetrics and Gynecology

## 2019-01-01 ENCOUNTER — Encounter: Payer: Self-pay | Admitting: Obstetrics and Gynecology

## 2019-01-01 ENCOUNTER — Ambulatory Visit: Payer: BC Managed Care – PPO

## 2019-01-01 VITALS — BP 137/89 | HR 98 | Ht 64.0 in | Wt 136.0 lb

## 2019-01-01 DIAGNOSIS — Z3042 Encounter for surveillance of injectable contraceptive: Secondary | ICD-10-CM

## 2019-01-01 DIAGNOSIS — Z01419 Encounter for gynecological examination (general) (routine) without abnormal findings: Secondary | ICD-10-CM

## 2019-01-01 DIAGNOSIS — Z3009 Encounter for other general counseling and advice on contraception: Secondary | ICD-10-CM

## 2019-01-01 DIAGNOSIS — Z113 Encounter for screening for infections with a predominantly sexual mode of transmission: Secondary | ICD-10-CM

## 2019-01-01 NOTE — Progress Notes (Signed)
GYNECOLOGY ANNUAL PREVENTATIVE CARE ENCOUNTER NOTE  Subjective:   Bailey Shaffer is a 33 y.o. G0P0000 female here for a annual gynecologic exam. Current complaints: low libido. Has been on depo for about 5 years, has no periods. Denies pain or cramping. Reports low libido and some pain with intercourse.  Denies abnormal vaginal bleeding, discharge, or other gynecologic concerns.   H/o pain with menses, so started on continuous pills, never had period and did well. Was on depo for about 5 years, then got the Nexplanon, hated it due to significant weight gain, cramping, bleeding. Had Nexplanon removed and then started back on depo.   Okay with STI testing, thinks she may have had HIV done at [facility], she will call them and find out.   Gynecologic History No LMP recorded. Patient has had an injection. Contraception: Depo-Provera injections Last Pap: 09/2017. Results were: negative Last mammogram: n/a  Obstetric History OB History  Gravida Para Term Preterm AB Living  0 0 0 0 0 0  SAB TAB Ectopic Multiple Live Births  0 0 0 0 0   History reviewed. No pertinent past medical history.  Past Surgical History:  Procedure Laterality Date  . CHOLECYSTECTOMY N/A 07/31/2016   Procedure: LAPAROSCOPIC CHOLECYSTECTOMY WITH POSSIBLE INTRAOPERATIVE CHOLANGIOGRAM;  Surgeon: Rolm Bookbinder, MD;  Location: [facility];  Service: General;  Laterality: N/A;    Current Outpatient Medications on File Prior to Visit  Medication Sig Dispense Refill  . cetirizine (ZYRTEC) 10 MG tablet Take 10 mg by mouth daily.    . TRINTELLIX 5 MG TABS Take 5 mg by mouth daily.  2  . medroxyPROGESTERone (DEPO-PROVERA) 150 MG/ML injection Inject 1 mL (150 mg total) into the muscle every 3 (three) months. 1 mL 4  . medroxyPROGESTERone (DEPO-PROVERA) 150 MG/ML injection Inject 1 mL (150 mg total) into the muscle every 3 (three) months. 1 mL 2   Current Facility-Administered Medications on File Prior to Visit  Medication Dose  Route Frequency Provider Last Rate Last Dose  . medroxyPROGESTERone (DEPO-PROVERA) injection 150 mg  150 mg Intramuscular Q90 days Shelly Bombard, MD   150 mg at 10/11/18 1625    Allergies  Allergen Reactions  . Erythromycin Rash    Pt states, "I am sensitive to most antibiotics."    Social History   Socioeconomic History  . Marital status: Married    Spouse name: Not on file  . Number of children: Not on file  . Years of education: Not on file  . Highest education level: Not on file  Occupational History  . Not on file  Social Needs  . Financial resource strain: Not on file  . Food insecurity    Worry: Not on file    Inability: Not on file  . Transportation needs    Medical: Not on file    Non-medical: Not on file  Tobacco Use  . Smoking status: Never Smoker  . Smokeless tobacco: Never Used  Substance and Sexual Activity  . Alcohol use: Yes    Alcohol/week: 14.0 standard drinks    Types: 14 Glasses of wine per week  . Drug use: No  . Sexual activity: Yes    Partners: Male    Birth control/protection: Injection  Lifestyle  . Physical activity    Days per week: Not on file    Minutes per session: Not on file  . Stress: Not on file  Relationships  . Social connections    Talks on phone: Not on file  Gets together: Not on file    Attends religious service: Not on file    Active member of club or organization: Not on file    Attends meetings of clubs or organizations: Not on file    Relationship status: Not on file  . Intimate partner violence    Fear of current or ex partner: Not on file    Emotionally abused: Not on file    Physically abused: Not on file    Forced sexual activity: Not on file  Other Topics Concern  . Not on file  Social History Narrative  . Not on file    History reviewed. No pertinent family history.  The following portions of the patient's history were reviewed and updated as appropriate: allergies, current medications, past  family history, past medical history, past social history, past surgical history and problem list.  Review of Systems Pertinent items are noted in HPI.   Objective:  BP 137/89   Pulse 98   Ht 5\' 4"  (1.626 m)   Wt 136 lb (61.7 kg)   BMI 23.34 kg/m  CONSTITUTIONAL: Well-developed, well-nourished female in no acute distress.  HENT:  Normocephalic, atraumatic, External right and left ear normal. Oropharynx is clear and moist EYES: Conjunctivae and EOM are normal. Pupils are equal, round, and reactive to light. No scleral icterus.  NECK: Normal range of motion, supple, no masses.  Normal thyroid.  SKIN: Skin is warm and dry. No rash noted. Not diaphoretic. No erythema. No pallor. NEUROLOGIC: Alert and oriented to person, place, and time. Normal reflexes, muscle tone coordination. No cranial nerve deficit noted. PSYCHIATRIC: Normal mood and affect. Normal behavior. Normal judgment and thought content. CARDIOVASCULAR: Normal heart rate noted, regular rhythm RESPIRATORY: Clear to auscultation bilaterally. Effort and breath sounds normal, no problems with respiration noted. BREASTS: Symmetric in size. No masses, skin changes, nipple drainage, or lymphadenopathy. ABDOMEN: Soft, normal bowel sounds, no distention noted.  No tenderness, rebound or guarding.  PELVIC: Normal appearing external genitalia; normal appearing vaginal mucosa and cervix.  No abnormal discharge noted.  Pelvic cultures obtained.  Normal uterine size, no other palpable masses, no uterine or adnexal tenderness. MUSCULOSKELETAL: Normal range of motion. No tenderness.  No cyanosis, clubbing, or edema.  2+ distal pulses.  Exam done with chaperone present.  Assessment and Plan:   1. Well woman exam No issues  2. Routine screening for STI (sexually transmitted infection) GC/CT/trich done today Thinks she had HIV done at PCP, will call them and find out  3. Encounter for counseling regarding contraception - Had long  discussion regarding long term use of depo, risks/benefits, theoretical risks of decreased BMD, risks of uncertain return to fertility - Reviewed other options for contraception, including IUD, she would like to continue depo - low libido likely 2/2 depo, but she is okay with this side effect due to other benefits   Will follow up results of STI screen and manage accordingly. Encouraged improvement in diet and exercise.  Mammogram n/a Flu vaccine UTD  Routine preventative health maintenance measures emphasized. Please refer to After Visit Summary for other counseling recommendations.   Total face-to-face time with patient: 30 minutes. Over 50% of encounter was spent on counseling and coordination of care.   , M.D. [facility]ending [facility] [employer])

## 2019-01-01 NOTE — Progress Notes (Addendum)
Depo given at today's visit. Pt tolerated injection well.  Pt advised to RTO 2/15-04/02/2019 for next depo.  Administrations This Visit    medroxyPROGESTERone (DEPO-PROVERA) injection 150 mg    Admin Date 01/01/2019 Action Given Dose 150 mg Route Intramuscular Administered By Valene Bors, CMA

## 2019-01-02 LAB — CERVICOVAGINAL ANCILLARY ONLY
Chlamydia: NEGATIVE
Comment: NEGATIVE
Comment: NEGATIVE
Comment: NORMAL
Neisseria Gonorrhea: NEGATIVE
Trichomonas: NEGATIVE

## 2019-03-27 ENCOUNTER — Other Ambulatory Visit: Payer: Self-pay

## 2019-03-27 MED ORDER — MEDROXYPROGESTERONE ACETATE 150 MG/ML IM SUSP: 150.0000 mg | INTRAMUSCULAR | 3 refills | Status: AC

## 2019-03-28 ENCOUNTER — Other Ambulatory Visit: Payer: Self-pay

## 2019-03-28 ENCOUNTER — Ambulatory Visit (INDEPENDENT_AMBULATORY_CARE_PROVIDER_SITE_OTHER): Payer: BC Managed Care – PPO

## 2019-03-28 VITALS — BP 138/88 | HR 93 | Wt 136.5 lb

## 2019-03-28 DIAGNOSIS — Z3042 Encounter for surveillance of injectable contraceptive: Secondary | ICD-10-CM

## 2019-03-28 MED ORDER — MEDROXYPROGESTERONE ACETATE 150 MG/ML IM SUSP
150.0000 mg | Freq: Once | INTRAMUSCULAR | Status: AC
  Administered 2019-03-28: 16:00:00 150 mg via INTRAMUSCULAR

## 2019-03-28 NOTE — Progress Notes (Addendum)
Presents for DEPO, given in LUOQ, tolerated well.  Next DEPO 5/12-26/2021  Administrations This Visit    medroxyPROGESTERone (DEPO-PROVERA) injection 150 mg    Admin Date 03/28/2019 Action Given Dose 150 mg Route Intramuscular Administered By Maretta Bees, RMA

## 2019-03-29 NOTE — Progress Notes (Signed)
Patient seen and assessed by nursing staff during this encounter. I have reviewed the chart and agree with the documentation and plan.  Catalina Antigua, MD 03/29/2019 10:23 AM

## 2019-06-21 ENCOUNTER — Other Ambulatory Visit: Payer: Self-pay

## 2019-06-21 ENCOUNTER — Ambulatory Visit (INDEPENDENT_AMBULATORY_CARE_PROVIDER_SITE_OTHER): Payer: BC Managed Care – PPO

## 2019-06-21 VITALS — BP 116/81 | HR 75 | Ht 64.0 in | Wt 138.0 lb

## 2019-06-21 DIAGNOSIS — Z3042 Encounter for surveillance of injectable contraceptive: Secondary | ICD-10-CM

## 2019-06-21 MED ORDER — MEDROXYPROGESTERONE ACETATE 150 MG/ML IM SUSP
150.0000 mg | Freq: Once | INTRAMUSCULAR | Status: AC
  Administered 2019-06-21: 150 mg via INTRAMUSCULAR

## 2019-06-21 NOTE — Progress Notes (Signed)
GYN presents for DEPO, given in RUOQ, tolerated well.  Next DEPO August 5-19, 2021  Administrations This Visit    medroxyPROGESTERone (DEPO-PROVERA) injection 150 mg    Admin Date 06/21/2019 Action Given Dose 150 mg Route Intramuscular Administered By Maretta Bees, RMA

## 2019-06-22 NOTE — Progress Notes (Signed)
Patient was assessed and managed by nursing staff during this encounter. I have reviewed the chart and agree with the documentation and plan. I have also made any necessary editorial changes.  Scheryl Darter, MD 06/22/2019 11:09 AM

## 2019-09-13 ENCOUNTER — Ambulatory Visit (INDEPENDENT_AMBULATORY_CARE_PROVIDER_SITE_OTHER): Payer: BC Managed Care – PPO

## 2019-09-13 ENCOUNTER — Other Ambulatory Visit: Payer: Self-pay

## 2019-09-13 DIAGNOSIS — Z3042 Encounter for surveillance of injectable contraceptive: Secondary | ICD-10-CM

## 2019-09-13 MED ORDER — MEDROXYPROGESTERONE ACETATE 150 MG/ML IM SUSP
150.0000 mg | INTRAMUSCULAR | 1 refills | Status: AC
Start: 2019-09-13 — End: ?

## 2019-09-13 MED ORDER — MEDROXYPROGESTERONE ACETATE 150 MG/ML IM SUSP
150.0000 mg | Freq: Once | INTRAMUSCULAR | Status: AC
  Administered 2019-09-13: 150 mg via INTRAMUSCULAR

## 2019-09-13 NOTE — Progress Notes (Signed)
Patient presents for Depo Injection today.   Last Depo 06/21/19 RUOQ   Next Depo Due 11/29/19-12/13/19   Depo given w/o any problems. LUOQ   Pt made aware to make Annual Exam for 1 yr refills and to continue Depo management.   Pt agreeable and voiced understanding.

## 2019-09-13 NOTE — Progress Notes (Signed)
Patient was assessed and managed by nursing staff during this encounter. I have reviewed the chart and agree with the documentation and plan. I have also made any necessary editorial changes.  Jaynie Collins, MD 09/13/2019 4:22 PM

## 2019-12-05 ENCOUNTER — Ambulatory Visit: Payer: BC Managed Care – PPO

## 2020-09-25 ENCOUNTER — Other Ambulatory Visit (HOSPITAL_COMMUNITY)
Admission: RE | Admit: 2020-09-25 | Discharge: 2020-09-25 | Disposition: A | Discharge status: Home / Self Care | Payer: BC Managed Care – PPO | Source: Ambulatory Visit | Attending: Family Medicine | Admitting: Family Medicine

## 2020-09-25 DIAGNOSIS — Z124 Encounter for screening for malignant neoplasm of cervix: Secondary | ICD-10-CM | POA: Insufficient documentation

## 2020-09-26 LAB — CYTOLOGY - PAP
Adequacy: ABSENT
Comment: NEGATIVE
Diagnosis: NEGATIVE
High risk HPV: NEGATIVE
